# Patient Record
Sex: Male | Born: 1935 | Race: White | Hispanic: No | Marital: Married | Smoking: Former smoker
Health system: Southern US, Community
[De-identification: ages and names within clinical notes are randomized; demographics above are authoritative.]

## PROBLEM LIST (undated history)

## (undated) DIAGNOSIS — Z87898 Personal history of other specified conditions: Secondary | ICD-10-CM

## (undated) DIAGNOSIS — C679 Malignant neoplasm of bladder, unspecified: Secondary | ICD-10-CM

## (undated) DIAGNOSIS — F0150 Vascular dementia without behavioral disturbance: Secondary | ICD-10-CM

## (undated) DIAGNOSIS — K08109 Complete loss of teeth, unspecified cause, unspecified class: Secondary | ICD-10-CM

## (undated) DIAGNOSIS — R972 Elevated prostate specific antigen [PSA]: Secondary | ICD-10-CM

## (undated) DIAGNOSIS — Z972 Presence of dental prosthetic device (complete) (partial): Secondary | ICD-10-CM

## (undated) DIAGNOSIS — D649 Anemia, unspecified: Secondary | ICD-10-CM

## (undated) DIAGNOSIS — Z973 Presence of spectacles and contact lenses: Secondary | ICD-10-CM

## (undated) DIAGNOSIS — N400 Enlarged prostate without lower urinary tract symptoms: Secondary | ICD-10-CM

## (undated) DIAGNOSIS — F05 Delirium due to known physiological condition: Secondary | ICD-10-CM

## (undated) DIAGNOSIS — Z85038 Personal history of other malignant neoplasm of large intestine: Secondary | ICD-10-CM

## (undated) HISTORY — PX: APPENDECTOMY: SHX54

## (undated) HISTORY — PX: TONSILLECTOMY: SUR1361

## (undated) HISTORY — PX: COLONOSCOPY: SHX174

---

## 1986-01-15 HISTORY — PX: LUMBAR SPINE SURGERY: SHX701

## 2005-03-06 ENCOUNTER — Encounter: Admission: RE | Admit: 2005-03-06 | Discharge: 2005-03-06 | Payer: Self-pay | Admitting: Family Medicine

## 2006-07-26 ENCOUNTER — Ambulatory Visit: Payer: Self-pay | Admitting: Internal Medicine

## 2006-08-08 ENCOUNTER — Ambulatory Visit: Payer: Self-pay | Admitting: Internal Medicine

## 2006-08-08 ENCOUNTER — Encounter: Payer: Self-pay | Admitting: Internal Medicine

## 2006-08-08 LAB — CONVERTED CEMR LAB
BUN: 8 mg/dL (ref 6–23)
Creatinine, Ser: 1 mg/dL (ref 0.4–1.5)

## 2006-08-09 ENCOUNTER — Ambulatory Visit: Payer: Self-pay | Admitting: Cardiology

## 2006-08-13 ENCOUNTER — Ambulatory Visit: Payer: Self-pay | Admitting: Internal Medicine

## 2006-08-13 LAB — CONVERTED CEMR LAB
ALT: 19 units/L (ref 0–53)
AST: 19 units/L (ref 0–37)
Albumin: 3.6 g/dL (ref 3.5–5.2)
BUN: 12 mg/dL (ref 6–23)
Basophils Absolute: 0 10*3/uL (ref 0.0–0.1)
Calcium: 9.9 mg/dL (ref 8.4–10.5)
Chloride: 107 meq/L (ref 96–112)
Eosinophils Absolute: 0.1 10*3/uL (ref 0.0–0.6)
Eosinophils Relative: 1.5 % (ref 0.0–5.0)
GFR calc Af Amer: 94 mL/min
GFR calc non Af Amer: 78 mL/min
MCHC: 34.4 g/dL (ref 30.0–36.0)
MCV: 86.7 fL (ref 78.0–100.0)
Monocytes Relative: 7.2 % (ref 3.0–11.0)
Neutro Abs: 3.9 10*3/uL (ref 1.4–7.7)
Platelets: 211 10*3/uL (ref 150–400)
RBC: 4.49 M/uL (ref 4.22–5.81)
TSH: 3.4 microintl units/mL (ref 0.35–5.50)
WBC: 5.9 10*3/uL (ref 4.5–10.5)

## 2006-08-16 ENCOUNTER — Encounter: Admission: RE | Admit: 2006-08-16 | Discharge: 2006-08-16 | Payer: Self-pay | Admitting: Surgery

## 2006-08-20 ENCOUNTER — Encounter (INDEPENDENT_AMBULATORY_CARE_PROVIDER_SITE_OTHER): Payer: Self-pay | Admitting: Surgery

## 2006-08-20 ENCOUNTER — Inpatient Hospital Stay (HOSPITAL_COMMUNITY): Admission: AD | Admit: 2006-08-20 | Discharge: 2006-08-26 | Payer: Self-pay | Admitting: Surgery

## 2006-08-20 HISTORY — PX: OTHER SURGICAL HISTORY: SHX169

## 2006-09-17 ENCOUNTER — Ambulatory Visit: Payer: Self-pay | Admitting: Hematology and Oncology

## 2006-10-09 LAB — CBC WITH DIFFERENTIAL/PLATELET
BASO%: 0.7 % (ref 0.0–2.0)
Eosinophils Absolute: 0.1 10*3/uL (ref 0.0–0.5)
HCT: 39.6 % (ref 38.7–49.9)
LYMPH%: 32.5 % (ref 14.0–48.0)
MCHC: 34.7 g/dL (ref 32.0–35.9)
MONO#: 0.4 10*3/uL (ref 0.1–0.9)
NEUT#: 3.2 10*3/uL (ref 1.5–6.5)
NEUT%: 58.5 % (ref 40.0–75.0)
Platelets: 202 10*3/uL (ref 145–400)
RBC: 4.67 10*6/uL (ref 4.20–5.71)
WBC: 5.5 10*3/uL (ref 4.0–10.0)
lymph#: 1.8 10*3/uL (ref 0.9–3.3)

## 2006-10-09 LAB — CEA: CEA: <0.5 ng/mL (ref 0.0–5.0)

## 2006-10-09 LAB — COMPREHENSIVE METABOLIC PANEL
ALT: 15 U/L (ref 0–53)
CO2: 28 mEq/L (ref 19–32)
Calcium: 9.1 mg/dL (ref 8.4–10.5)
Chloride: 103 mEq/L (ref 96–112)
Glucose, Bld: 85 mg/dL (ref 70–99)
Sodium: 141 mEq/L (ref 135–145)
Total Bilirubin: 0.5 mg/dL (ref 0.3–1.2)
Total Protein: 6.5 g/dL (ref 6.0–8.3)

## 2006-12-30 ENCOUNTER — Ambulatory Visit: Payer: Self-pay | Admitting: Hematology and Oncology

## 2007-01-01 ENCOUNTER — Ambulatory Visit (HOSPITAL_COMMUNITY): Admission: RE | Admit: 2007-01-01 | Discharge: 2007-01-01 | Payer: Self-pay | Admitting: Hematology and Oncology

## 2007-01-01 LAB — COMPREHENSIVE METABOLIC PANEL
ALT: 22 U/L (ref 0–53)
AST: 21 U/L (ref 0–37)
Albumin: 4 g/dL (ref 3.5–5.2)
Alkaline Phosphatase: 57 U/L (ref 39–117)
BUN: 13 mg/dL (ref 6–23)
Chloride: 101 mEq/L (ref 96–112)
Potassium: 4.4 mEq/L (ref 3.5–5.3)
Sodium: 139 mEq/L (ref 135–145)

## 2007-01-01 LAB — CBC WITH DIFFERENTIAL/PLATELET
BASO%: 0.4 % (ref 0.0–2.0)
EOS%: 1.4 % (ref 0.0–7.0)
MCH: 29.3 pg (ref 28.0–33.4)
MCHC: 34.5 g/dL (ref 32.0–35.9)
MONO#: 0.4 10*3/uL (ref 0.1–0.9)
RBC: 4.96 10*6/uL (ref 4.20–5.71)
RDW: 13.6 % (ref 11.2–14.6)
WBC: 4.8 10*3/uL (ref 4.0–10.0)
lymph#: 1.4 10*3/uL (ref 0.9–3.3)

## 2007-01-01 LAB — CEA: CEA: 0.9 ng/mL (ref 0.0–5.0)

## 2007-01-24 ENCOUNTER — Ambulatory Visit (HOSPITAL_COMMUNITY): Admission: RE | Admit: 2007-01-24 | Discharge: 2007-01-24 | Payer: Self-pay | Admitting: Hematology and Oncology

## 2007-02-03 ENCOUNTER — Ambulatory Visit: Payer: Self-pay | Admitting: Thoracic Surgery (Cardiothoracic Vascular Surgery)

## 2007-02-13 ENCOUNTER — Ambulatory Visit: Payer: Self-pay | Admitting: Thoracic Surgery (Cardiothoracic Vascular Surgery)

## 2007-02-18 ENCOUNTER — Ambulatory Visit: Payer: Self-pay | Admitting: Thoracic Surgery (Cardiothoracic Vascular Surgery)

## 2007-02-18 ENCOUNTER — Encounter: Payer: Self-pay | Admitting: Thoracic Surgery (Cardiothoracic Vascular Surgery)

## 2007-02-18 ENCOUNTER — Inpatient Hospital Stay (HOSPITAL_COMMUNITY)
Admission: RE | Admit: 2007-02-18 | Discharge: 2007-02-23 | Payer: Self-pay | Admitting: Thoracic Surgery (Cardiothoracic Vascular Surgery)

## 2007-02-18 HISTORY — PX: VIDEO ASSISTED THORACOSCOPY (VATS)/WEDGE RESECTION: SHX6174

## 2007-02-28 ENCOUNTER — Ambulatory Visit: Payer: Self-pay | Admitting: Thoracic Surgery (Cardiothoracic Vascular Surgery)

## 2007-03-21 ENCOUNTER — Ambulatory Visit: Payer: Self-pay | Admitting: Cardiothoracic Surgery

## 2007-03-21 ENCOUNTER — Encounter: Admission: RE | Admit: 2007-03-21 | Discharge: 2007-03-21 | Payer: Self-pay | Admitting: Cardiothoracic Surgery

## 2007-04-14 ENCOUNTER — Encounter
Admission: RE | Admit: 2007-04-14 | Discharge: 2007-04-14 | Payer: Self-pay | Admitting: Thoracic Surgery (Cardiothoracic Vascular Surgery)

## 2007-04-14 ENCOUNTER — Ambulatory Visit: Payer: Self-pay | Admitting: Thoracic Surgery (Cardiothoracic Vascular Surgery)

## 2007-04-29 DIAGNOSIS — C189 Malignant neoplasm of colon, unspecified: Secondary | ICD-10-CM | POA: Insufficient documentation

## 2007-04-29 DIAGNOSIS — D126 Benign neoplasm of colon, unspecified: Secondary | ICD-10-CM | POA: Insufficient documentation

## 2007-10-06 ENCOUNTER — Ambulatory Visit: Payer: Self-pay | Admitting: Thoracic Surgery (Cardiothoracic Vascular Surgery)

## 2007-10-06 ENCOUNTER — Encounter
Admission: RE | Admit: 2007-10-06 | Discharge: 2007-10-06 | Payer: Self-pay | Admitting: Thoracic Surgery (Cardiothoracic Vascular Surgery)

## 2008-02-26 ENCOUNTER — Encounter: Payer: Self-pay | Admitting: Internal Medicine

## 2008-05-28 ENCOUNTER — Encounter: Payer: Self-pay | Admitting: Internal Medicine

## 2008-06-28 ENCOUNTER — Ambulatory Visit: Payer: Self-pay | Admitting: Internal Medicine

## 2008-07-15 ENCOUNTER — Encounter: Payer: Self-pay | Admitting: Internal Medicine

## 2008-07-15 ENCOUNTER — Ambulatory Visit: Payer: Self-pay | Admitting: Internal Medicine

## 2008-07-20 ENCOUNTER — Encounter: Payer: Self-pay | Admitting: Internal Medicine

## 2008-12-06 IMAGING — CR DG CHEST 1V PORT
1 series · 1 of 1 positions shown · non-contrast
Comparison: Earlier the same day.

CLINICAL DATA: Lung lesion post VATS. 
 PORTABLE CHEST - 1 VIEW - 02/21/07 AT 1718 HOURS:

[view not recorded]
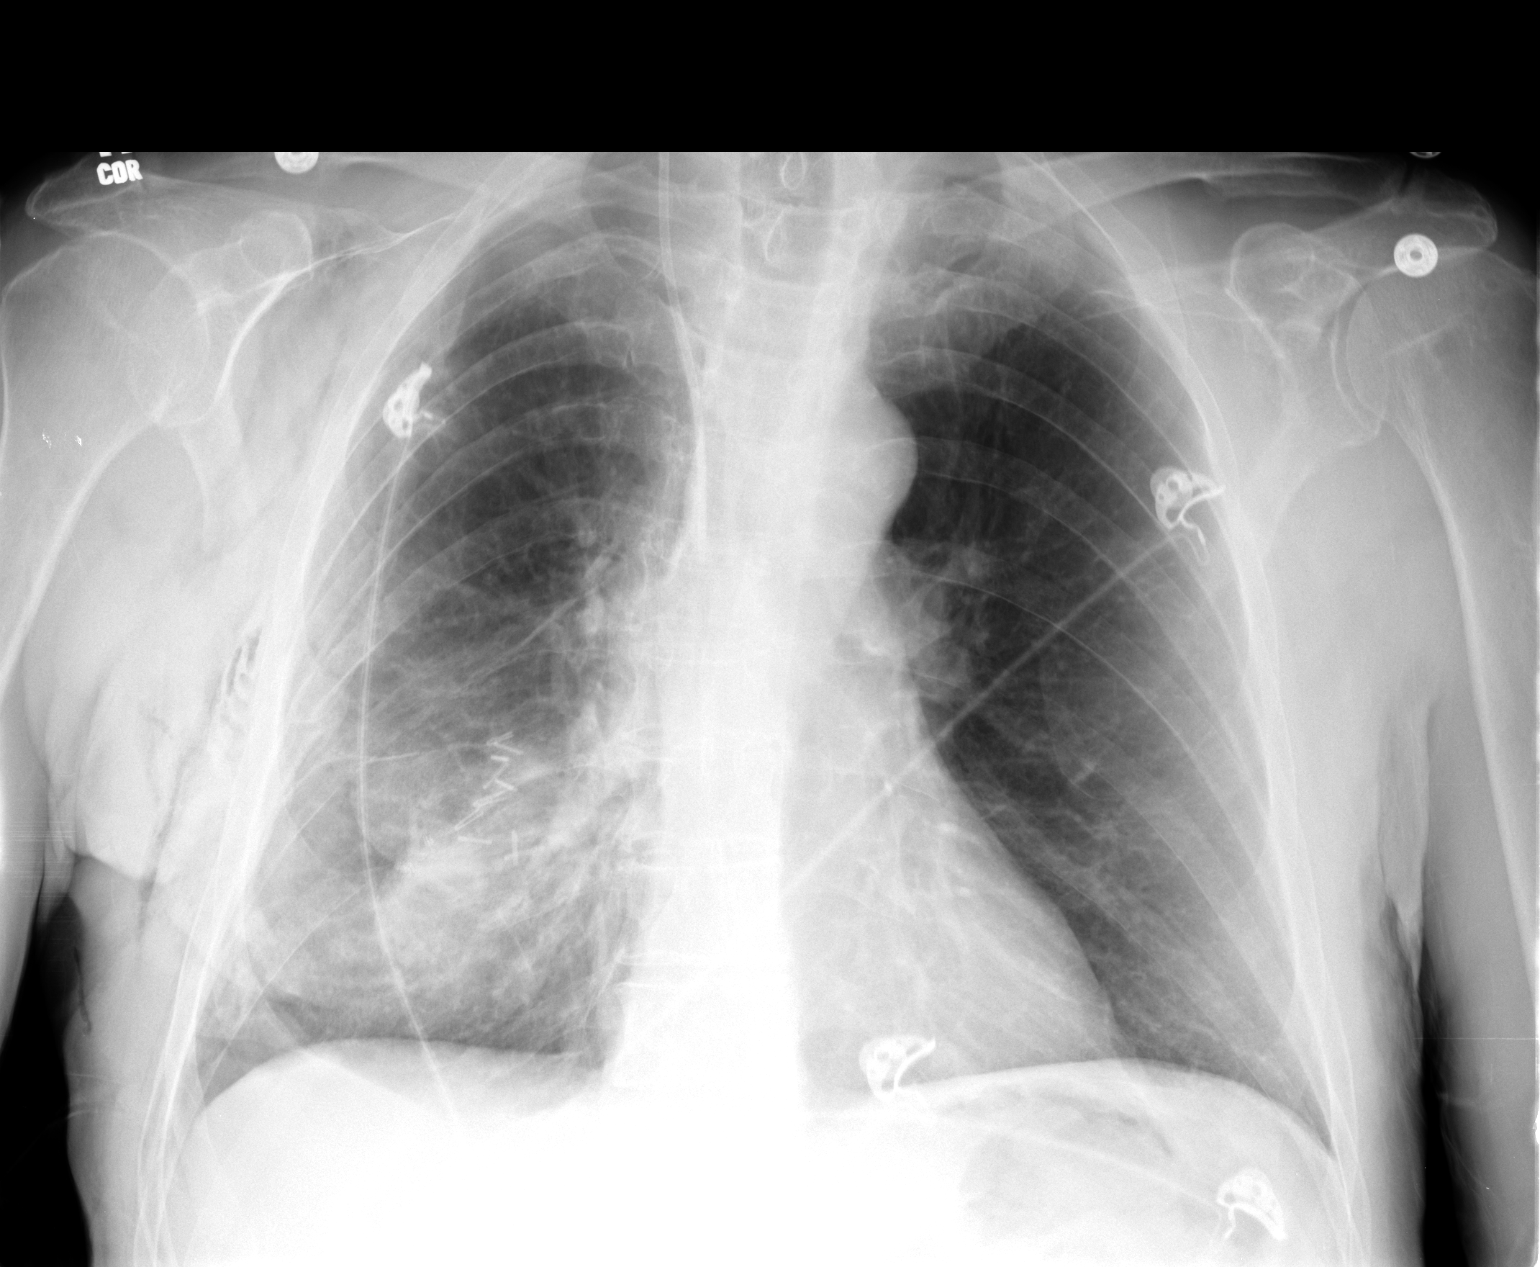

[1 of 1 positions shown; findings below may reference images not displayed]

FINDINGS: Right IJ central venous catheter is in stable position.  The right-sided pneumothorax appears unchanged with apical and basilar components, estimated at 15%.  Right perihilar atelectasis or contusion is unchanged.  The left lung is clear.
IMPRESSION: Stable right-sided pneumothorax following chest tube removal.  No new findings.

## 2008-12-07 IMAGING — CR DG CHEST 2V
2 series · 2 of 2 positions shown · non-contrast
Comparison: 02/21/07.

CLINICAL DATA: pneumothorax
PA AND LATERAL CHEST - 2 VIEW - 02/22/07:

[w chest pa]
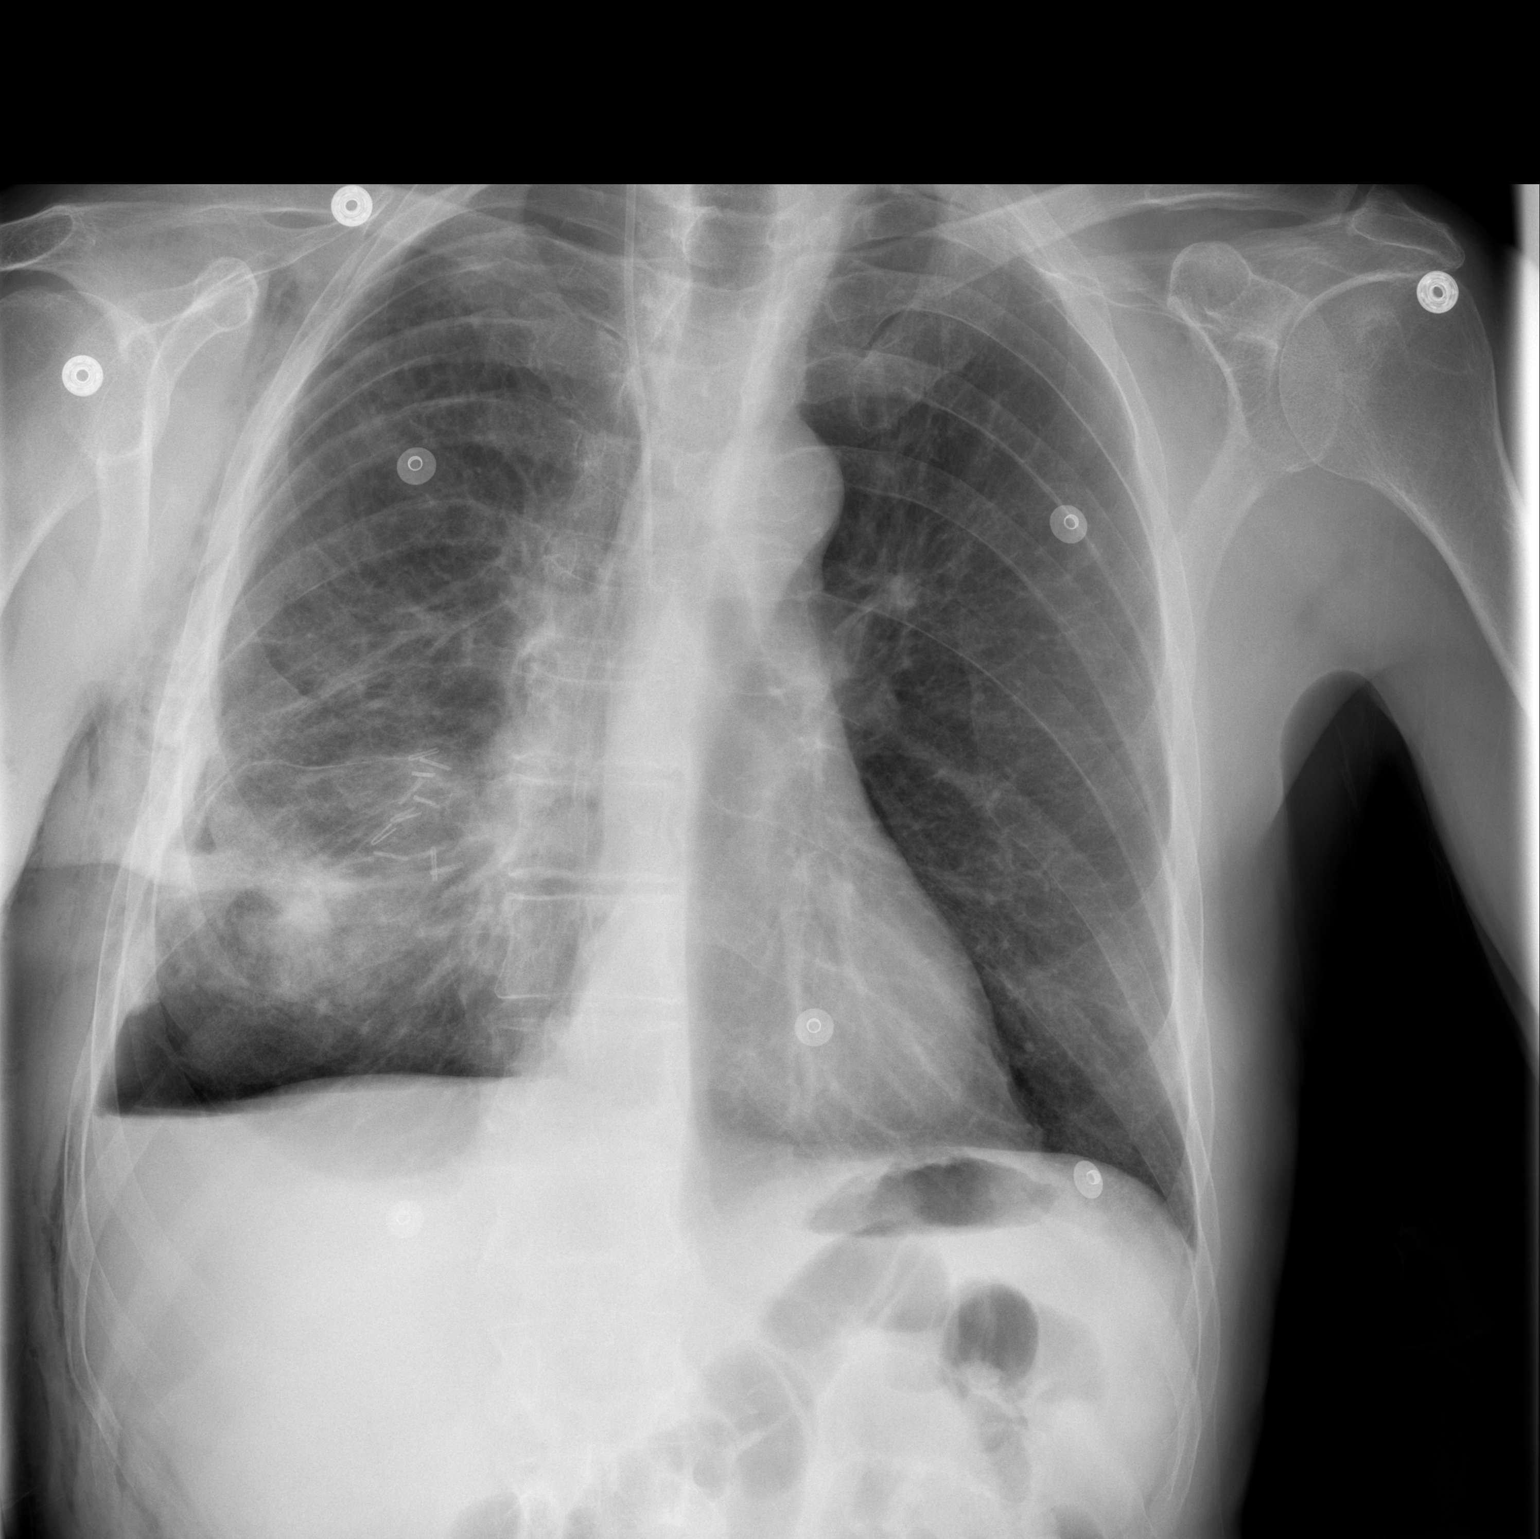

[w chest lat]
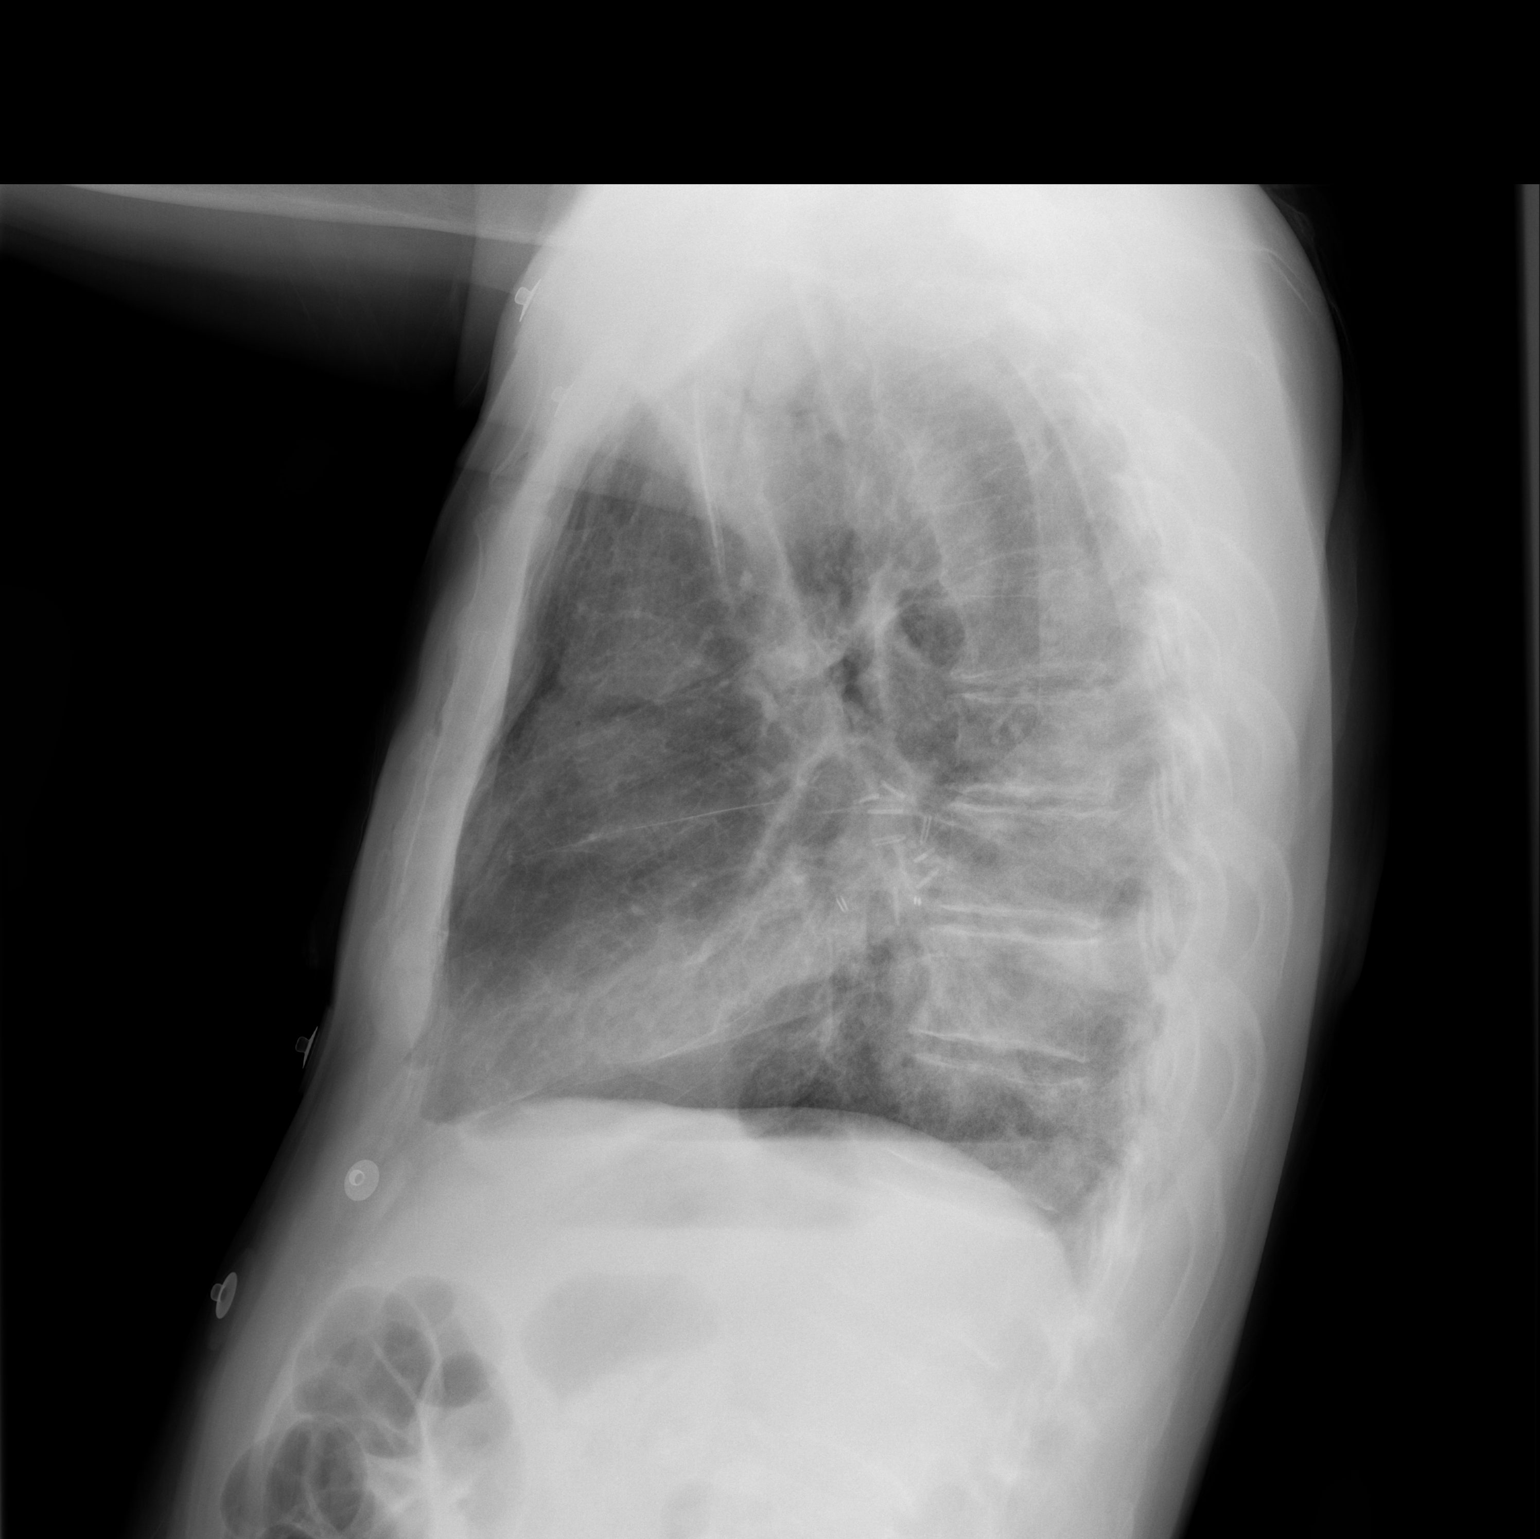

[2 of 2 positions shown; findings below may reference images not displayed]

FINDINGS: There is slight increase in the size of the basal component of the right pneumothorax.  The patient now has some right effusion.  The apical component of the pneumothorax is essentially unchanged.  There is some atelectasis at the right lung base which is stable.  The left lung is clear.  Heart size and vascularity are normal.
IMPRESSION: Slight increased right basal pneumothorax.  No change in the apical component.

## 2008-12-08 IMAGING — CR DG CHEST 2V
2 series · 2 of 2 positions shown · non-contrast
Comparison: 02/22/07.

CLINICAL DATA: Pneumothorax. 
 CHEST ? 2 VIEW:

[w chest pa]
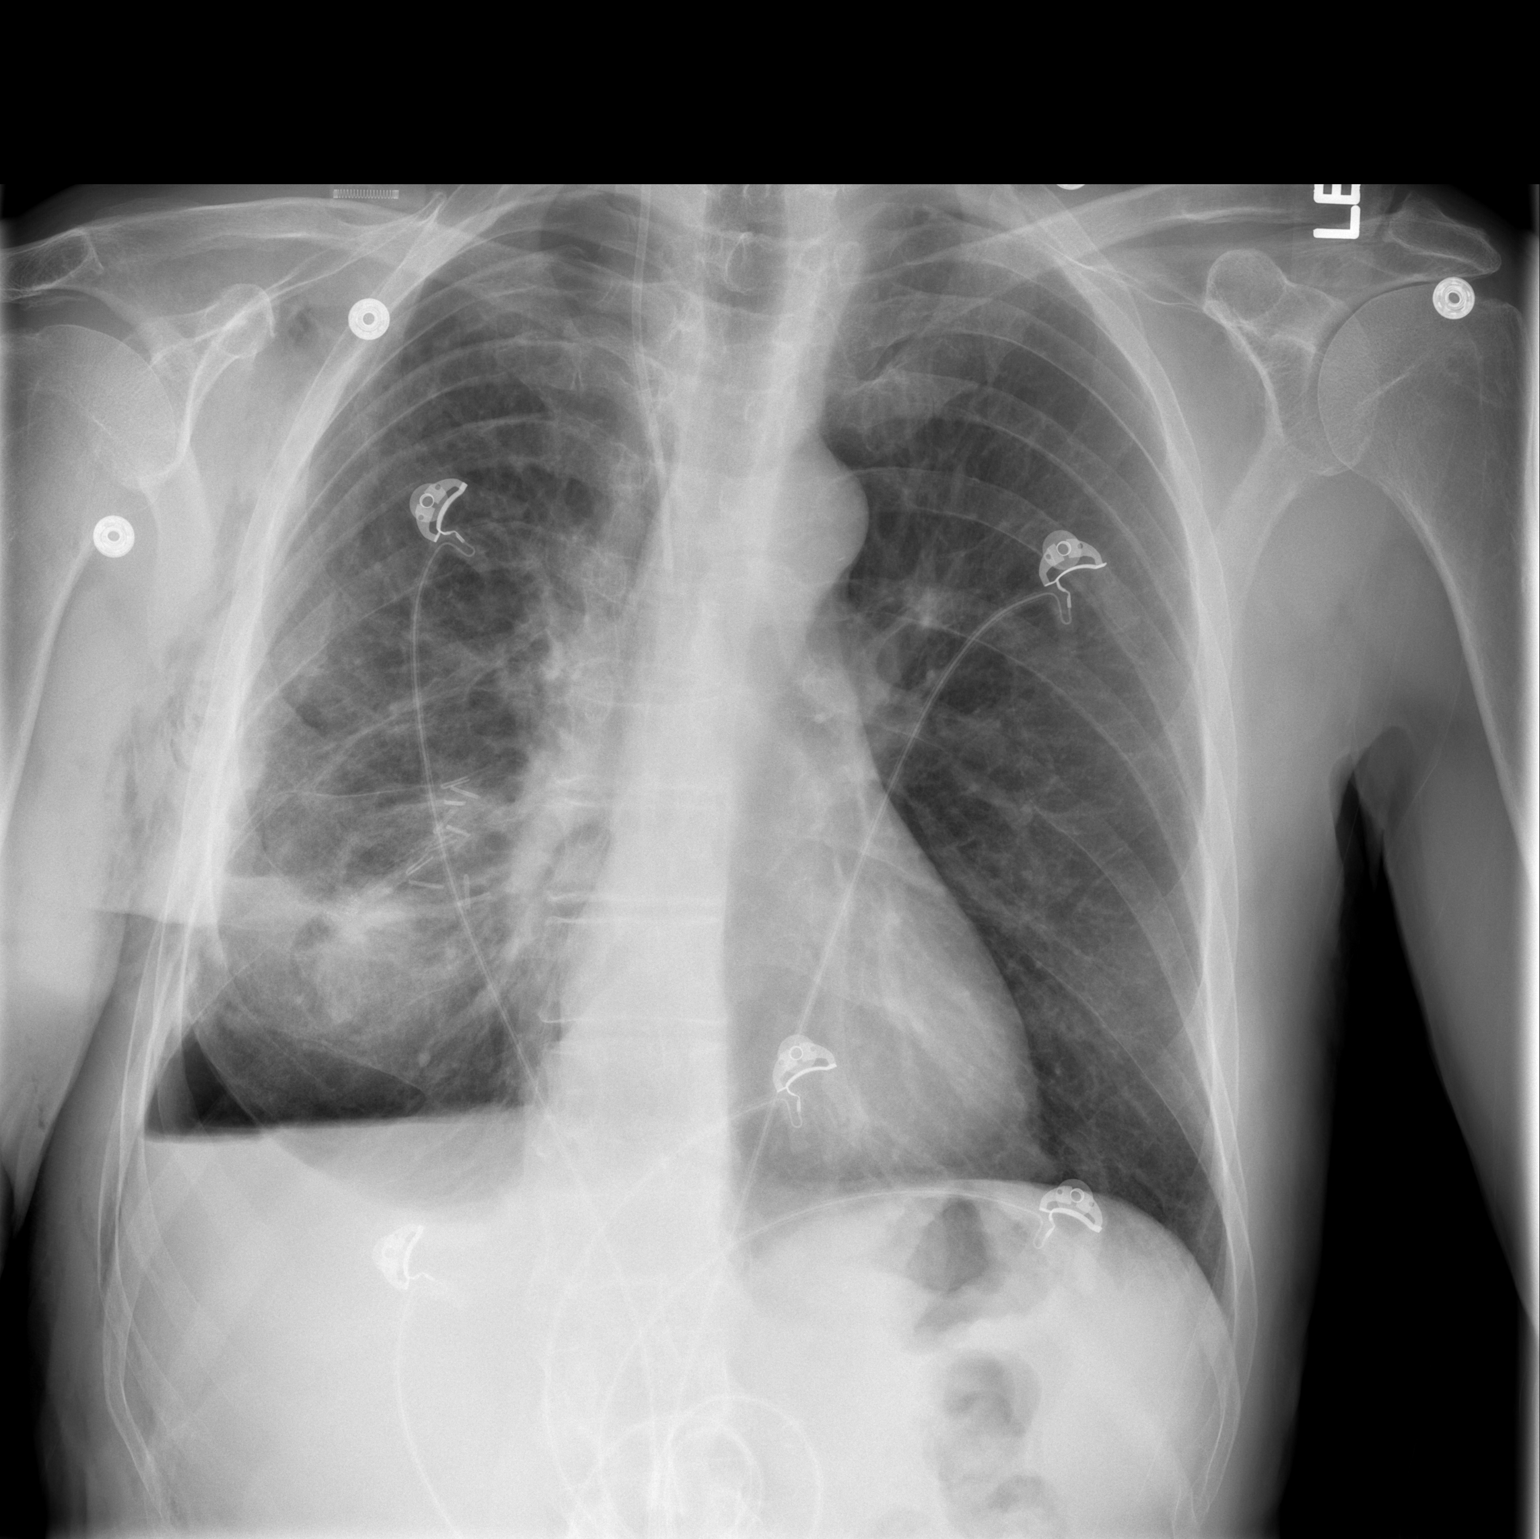

[w chest lat]
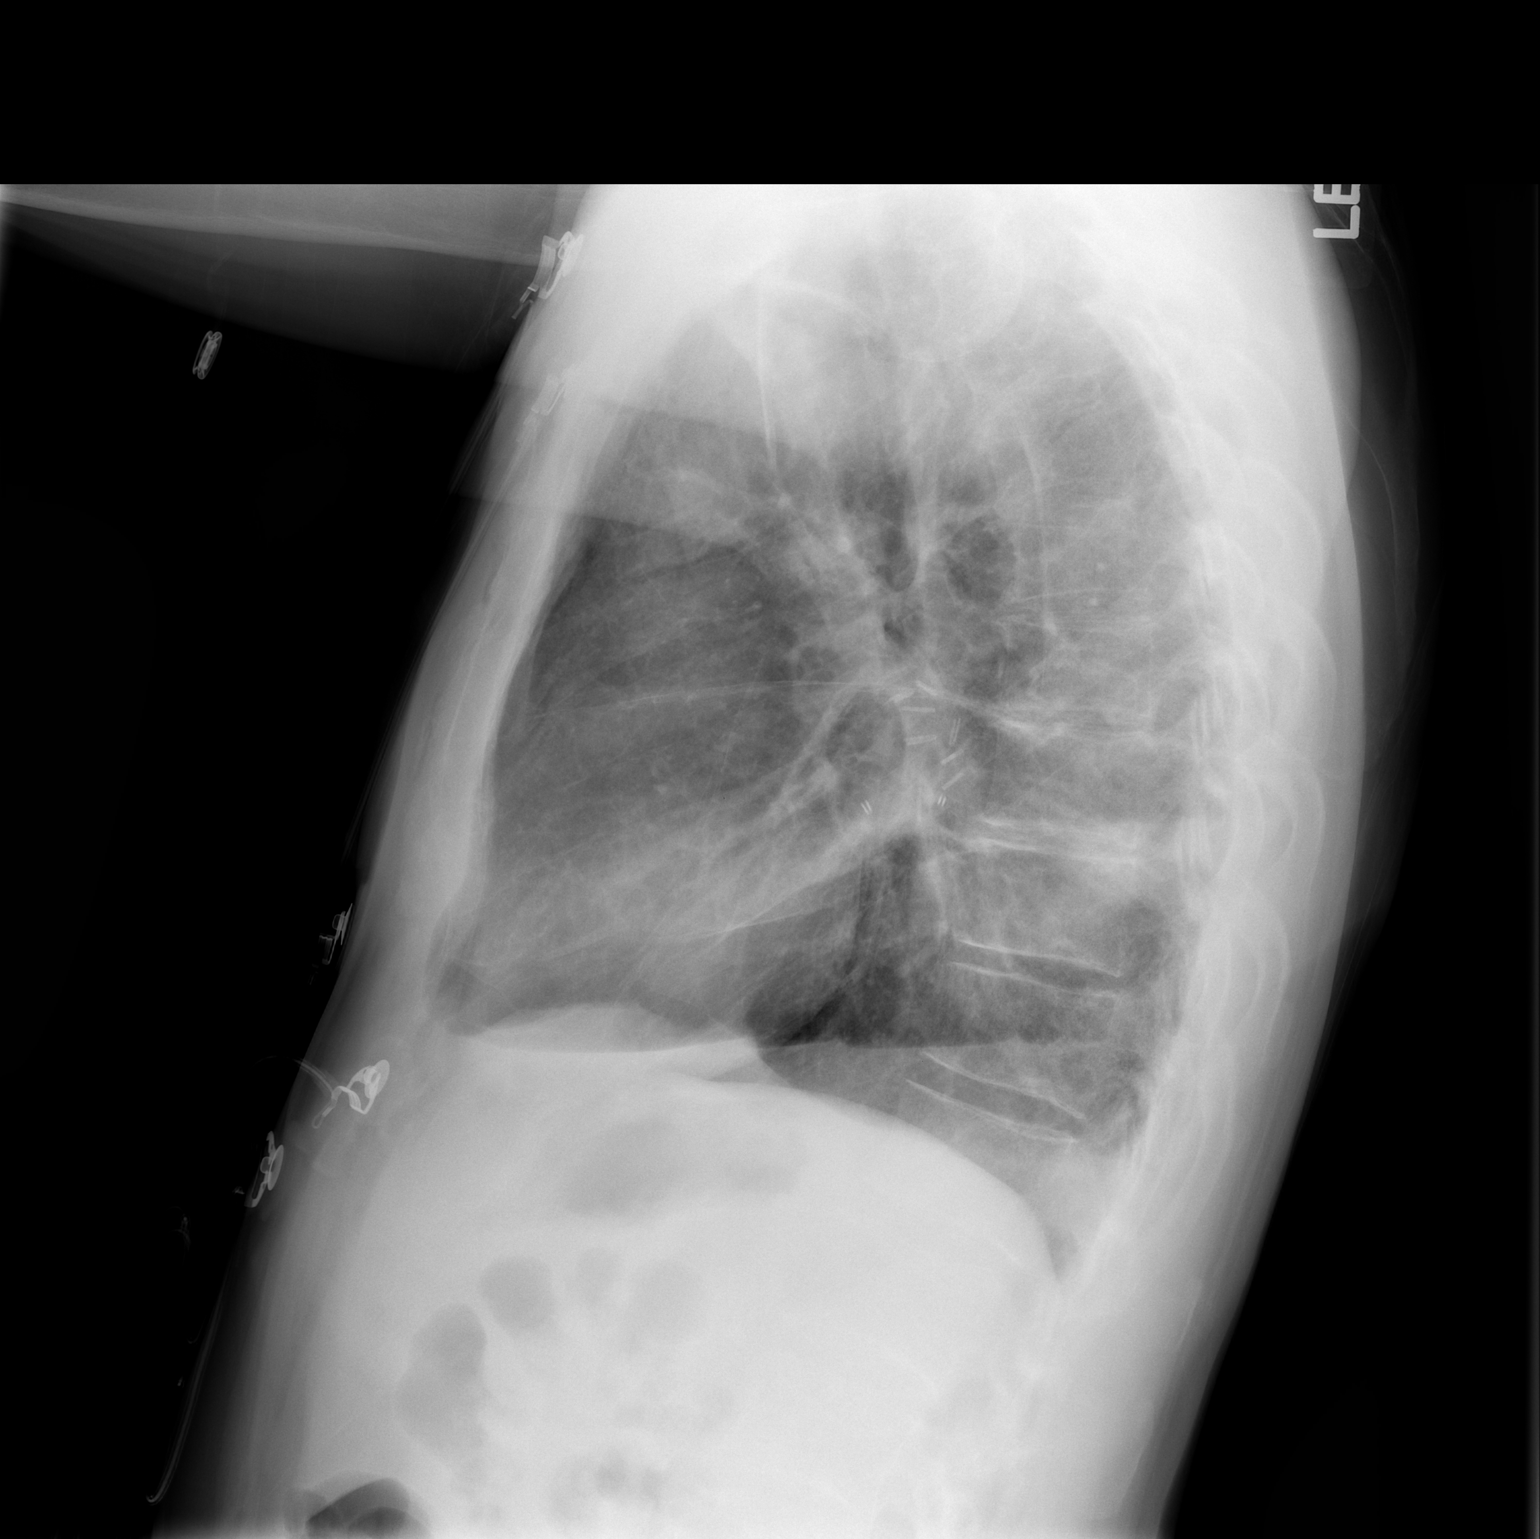

[2 of 2 positions shown; findings below may reference images not displayed]

FINDINGS: The right basal hydropneumothorax and small apical component of pneumothorax are unchanged.  No increase in the subcutaneous emphysema.  The left lung remains clear.  Heart size and vascularity are normal.  Central line is unchanged.
IMPRESSION: No significant change.

## 2010-05-30 NOTE — Op Note (Signed)
NAME:  Brett Santiago, Brett Santiago NO.:  [id number]   MEDICAL RECORD NO.:  [id number]          PATIENT TYPE:  INP   LOCATION:  3302                         FACILITY:  [facility]   PHYSICIAN:  Salvatore Decent. Cornelius Moras, M.D. DATE OF BIRTH:  Jul 23, 1929   DATE OF PROCEDURE:  02/18/2007  DATE OF DISCHARGE:                               OPERATIVE REPORT   PREOPERATIVE DIAGNOSIS:  Right lung nodule and hypermetabolic right  hilar lymph node.   POSTOPERATIVE DIAGNOSIS:  Right lung nodule and hypermetabolic right  hilar lymph node.   PROCEDURE:  Right video assisted thoracoscopic surgery for wedge  resection of right lung nodule and opened thoracoscopic biopsy of right  hilar lymph node.   SURGEON:  Dr. Purcell Nails   ASSISTANT:  Ms. Zadie Rhine   ANESTHESIA:  General.   BRIEF CLINICAL NOTE:  The patient is a 77 year old gentleman who has  been healthy most of his life.  In August 2008 he underwent right  colectomy for node-negative adenocarcinoma of the colon.  He was seen in  follow-up [facility]  A  routine screening CT scan of the chest, abdomen and pelvis was performed  in December 2008.  CT scan revealed a suspicious nodule in the right  lung.  A PET CT scan was subsequently performed.  The lung nodule was  not hypermetabolic but a hypermetabolic lymph node was noted in the  right hilum.  The patient was referred for thoracic surgical  consultation.  Alternative treatment strategies were discussed at  length.  In particular, the possibility of close observation versus  proceeding directly for surgical biopsy were entertained.  After  considerable discussion, the patient specifically desires to proceed  with surgery for definitive biopsy of the lesions in question.  A full  consultation has been dictated previously.  The patient and wife  understand and accept all potential associated risks of surgery and  desire to proceed as  described.   OPERATIVE NOTE IN DETAIL:  The patient brought to the operating room on  the above-mentioned date and placed in the supine position on the  operating table.  Radial arterial line and central venous catheters were  placed for intravenous access and monitoring purposes.  Pneumatic  sequential compression boots were placed on both lower extremities.  Intravenous antibiotics were administered.  General endotracheal  anesthesia is induced uneventfully using a dual-lumen endotracheal tube.  A Foley catheter is placed.  The patient is turned to the left lateral  decubitus position using a pneumatic beanbag device and axillary roll to  facilitate positioning.  Single lung ventilation is begun.  The  patient's right chest is prepared, draped in sterile manner.   A small incision is made overlying the seventh intercostal space  anteriorly.  The incision is completed through the subcutaneous tissues  and intercostal musculature with electrocautery.  The right pleural  space was entered bluntly.  A 10 mm port is passed through the incision  and the right chest was explored visually with the endoscopic camera.  There are no adhesions and  the lung was completely collapsed.  There are  no gross abnormalities visible on the surface of the lung.  There is no  gross abnormalities appreciated in the hilum.  There is no pleural  effusion.  Two additional incisions were placed including one located  posterolaterally through the fifth intercostal space and another located  anteriorly through the fourth intercostal space.  Initially the lung was  mobilized up against the surgeon's digit which is extended through the  posterolateral incision in an effort to palpate the nodule noted to be  lateral in the right lower lobe.  This nodule somewhat difficult to  locate, but ultimately was located straight laterally just below the  major fissure in the right lower lobe.  The Echelon endoscopic GIA   stapling device was utilized to perform wedge resection of a small  portion of the right lower lobe with the afflicted nodule within it.  This is technically straightforward and the specimen is sent to  pathology for frozen section histology.  Preliminary frozen section  histology of the right lower lobe mass was notable for benign hamartoma.   Attention is now addressed towards the area where the abnormal lymph  node is noted in the right hilum on preoperative chest CT scan and PET  scan.  In particular, this lymph node is truly an interlobar lymph node  located in the floor of the major fissure alongside the takeoff of the  segmental branches of the pulmonary artery to the right middle lobe just  off the continuation of the right pulmonary artery.  On palpating in  this region one can appreciate a firm abnormally enlarged lymph node.  The floor of the major fissure was incised and careful dissection is  performed to expose this lymph node.  This lymph node appears to be  chronically inflamed and is quite adherent to the adjacent pulmonary  artery.  Dissection is somewhat tedious, but ultimately the lymph node  is dissected free satisfactorily to facilitate biopsy of a portion of  this firm lymph node.  A portion of this was sent to pathology for  frozen section histology.  Preliminary frozen section histology is  notable for benign lymph node with no sign of any malignancy.  Initial  portion of the lymph node is sent to pathology also for culture to  include fungal, AFB and routine cultures.  Meticulous hemostasis  ascertained.  Several medium size hemoclips were placed in the vicinity  of this enlarged lymph node to mark it radiographically.  The right  chest was filled with warm saline solution and the right lung was  briefly inflated.  There is no sign of significant air leak.   The On-Q continuous pain management system is utilized to facilitate  postoperative pain control.  One  5-inch catheter supplied with the On-Q  kit is tunneled initially through the subcutaneous tissues and then  tunneled posteriorly and placed subpleurally to cover the second through  the sixth intercostal nerve roots.  The catheter was flushed with 5 mL  of 0.5% bupivacaine solution and ultimately connected to a continuous  infusion pump.  The right chest was drained using a single 28-French  right-angle chest tube exited through the original thoracoscopic port  incision inferiorly.  The two remaining miniature thoracotomy incisions  were closed in multiple layers and their skin incisions were closed with  subcuticular skin closures.   The patient tolerated the procedure well, was extubated in the operating  room, and transported recovery room  in stable condition.  There are no  intraoperative complications.  All sponge, instrument and needle counts  verified correct.  Estimated blood loss for the procedure was less than  150 mL.      Salvatore Decent. Cornelius Moras, M.D.  Electronically Signed     CHO/MEDQ  D:  02/18/2007  T:  02/19/2007  Job:  [id number]   cc:   Vicente Serene I. Odogwu, M.D.  Quita Skye Artis Flock, M.D.  Currie Paris, M.D.  Wilhemina Bonito. Marina Goodell, MD

## 2010-05-30 NOTE — Assessment & Plan Note (Signed)
OFFICE VISIT   Brett Santiago, Brett Santiago  DOB:  [birth year]                                        February 13, 2007  CHART #:  [id number]   HISTORY OF PRESENT ILLNESS:  The patient returns for further followup  related to his recently discovered lung nodules.  He was originally seen  in consultation on February 03, 2007.  Since then he is contemplating  alternative treatment strategies, including continued close observation  versus proceeding directly to surgery for right VATS and probable right  thoracotomy for wedge resection of the peripheral right lung nodule and  biopsy of the hilar lymph node that was abnormal on recent PET scan.  The patient has decided that he favors the more aggressive approach and  hopes to proceed with surgery.  I am hopeful that there may still be a  chance that these matters are benign, but the hypermetabolic mass in the  right hilum is a bit concerning.  I have discussed matters at length  again with the patient and his wife.  The risks and benefits of the  alternative approaches have been discussed.  The nature of the surgical  procedure has been reviewed in detail, and we spent in excess of 30  minutes discussing the procedure with its attendant risks and benefits.  All of their questions have been addressed.  We plan to proceed with  right VATS and probable right thoracotomy for wedge resection of the  peripheral lung nodule, direct open biopsy of the right hilar lymph  node.  They understand that if either of these lesions are malignant,  this may warrant further pulmonary resection if these appears to be  primary lung cancer, potentially by lobectomy.  They understand and  accept all associated risks of surgery and desire to proceed as  described.   Salvatore Decent. Cornelius Moras, M.D.  Electronically Signed   CHO/MEDQ  D:  02/13/2007  T:  02/14/2007  Job:  [id number]   cc:   Vicente Serene I. Odogwu, M.D.  Quita Skye Artis Flock, M.D.  Currie Paris,  M.D.  Wilhemina Bonito. Marina Goodell, MD

## 2010-05-30 NOTE — Discharge Summary (Signed)
Brett Santiago, Brett Santiago NO.:  [id number]   MEDICAL RECORD NO.:  [id number]          PATIENT TYPE:  INP   LOCATION:  1522                         FACILITY:  [facility]   PHYSICIAN:  Currie Paris, M.D.DATE OF BIRTH:  04/25/1929   DATE OF ADMISSION:  08/20/2006  DATE OF DISCHARGE:  08/26/2006                               DISCHARGE SUMMARY   OFFICE MEDICAL RECORD NUMBER:  [id number].   FINAL DIAGNOSIS:  Invasive moderately differentiated adenocarcinoma (2-  cm) of ascending colon (T2 N0).   CLINICAL HISTORY:  [name] is a 77 year old gentleman who presented  with blood in his stool which was guaiac positive.  He had a colonoscopy  and carcinoma of the ascending colon near the cecum was found.  He was  admitted for surgery.   HOSPITAL COURSE:  The patient was admitted and taken to the operating  room where a laparoscopic-assisted right colectomy was performed.  He  tolerated the procedure well.  Postoperatively, he had a fairly benign course.  He was a little slow to  pass gas but once that happened, we were able to begin some liquids and  advance his diet.  By August 26, 2006, he was passing gas, taking p.o.  nicely with no nausea or vomiting.  His abdomen was soft and nontender,  and he had some bowel sounds noted.  It was felt that he was able to be  discharged and he was sent home on a regular diet, usual home  medications, and Vicodin for pain.   LABORATORY STUDIES:  His pathology showed carcinoma as above.  Nineteen  nodes were negative for metastatic carcinoma.  His admission hemoglobin  pre-op was in the 13 range.  We got one post-op that was 17.9 but a  followup the next day was back to 13.2, and 17.9 was thought to be lab  error.  Urinalysis was negative.  EKG showed a sinus bradycardia but was  otherwise normal.  Preoperative chest x-ray showed no acute  cardiopulmonary disease.      Currie Paris, M.D.  Electronically Signed     CJS/MEDQ  D:  09/13/2006  T:  09/13/2006  Job:  [id number]   cc:   Quita Skye. Artis Flock, M.D.  Fax: [phone]   Wilhemina Bonito. Marina Goodell, MD  [address]  [place]  [place] [address]

## 2010-05-30 NOTE — Discharge Summary (Signed)
NAME:  Brett Santiago, HELMING NO.:  [id number]   MEDICAL RECORD NO.:  [id number]          PATIENT TYPE:  INP   LOCATION:  2006                         FACILITY:  [facility]   PHYSICIAN:  Salvatore Decent. Cornelius Moras, M.D. DATE OF BIRTH:  [birth year]   DATE OF ADMISSION:  02/18/2007  DATE OF DISCHARGE:  02/23/2007                               DISCHARGE SUMMARY   ADDENDUM:  An addendum to the patient's discharge summary dictated on  February 21, 2007.   The patient was initially felt to be ready for discharge home on  February 22, 2007. His chest tube was discontinued February 21, 2007 with  a followup chest x-ray showing only a small pneumothorax. A repeat PA  and lateral chest x-ray done in the a.m. on February 7th for followup  showed a slight increase in the patient's right pneumothorax. It was  felt due to this change in his chest x-ray that the patient should stay  in the hospital another day for repeat followup chest x-ray to monitor  his pneumothorax. The patient's vital signs were stable, and he remained  off oxygen, satting greater than 90%. He denied any shortness of breath.  A repeat PA and lateral chest x-ray done on February 23, 2007 showed his  right hydropneumothorax to be stable. No other issues were noted during  this time. The patient was felt to be stable and ready for discharge  home on February 23, 2007. For details of the patient's followup  appointments, discharge instructions, and medications please see the  dictated discharge summary.      Theda Belfast, PA      Salvatore Decent. Cornelius Moras, M.D.  Electronically Signed    KMD/MEDQ  D:  04/01/2007  T:  04/01/2007  Job:  [id number]   cc:   Salvatore Decent. Cornelius Moras, M.D.

## 2010-05-30 NOTE — Op Note (Signed)
NAME:  Brett Santiago, Brett Santiago NO.:  [id number]   MEDICAL RECORD NO.:  [id number]          PATIENT TYPE:  INP   LOCATION:  0003                         FACILITY:  [facility]   PHYSICIAN:  Currie Paris, M.D.DATE OF BIRTH:  August 08, 1929   DATE OF PROCEDURE:  08/20/2006  DATE OF DISCHARGE:                               OPERATIVE REPORT   PREOPERATIVE DIAGNOSES:  Carcinoma of the cecum.   POSTOPERATIVE DIAGNOSES:  Carcinoma of the cecum.   OPERATION:  Laparoscopic-assisted right hemicolectomy.   SURGEON:  Dr. Jamey Ripa   ASSISTANT:  Dr. Derrell Lolling.   ANESTHESIA:  General endotracheal.   CLINICAL HISTORY:  Brett Santiago is a 77 year old gentleman who presents  with a right colon cancer that was really in the cecum shown by biopsy.  Preoperative evaluation showed a questionable liver lesion but was  unable to be characterized definitely by CT or MRI but thought to  represent an atypical hemangioma.  He was brought to the operating room  for right colectomy.   DESCRIPTION OF PROCEDURE:  The patient was seen in the holding area and  had no further questions.  He was taken to the operating room and after  satisfactory general endotracheal anesthesia had been obtained the  abdomen was clipped, prepped and draped and a catheter placed in the  bladder.   The time-out occurred.   I started by making an umbilical incision, opening the fascia and  entering the peritoneal cavity under direct vision.  A pursestring was  placed, the Hasson introduced and the abdomen insufflated to 15.  With  the camera in place I put a 5 mm port in the epigastrium and a 10/11 in  the left lower quadrant, both placed under direct vision.  Perusal of  the abdominal cavity revealed multiple adhesions of the cecum up to the  anterior abdominal wall from his prior appendectomy.  The liver looked  completely normal.  There was no evidence of peritoneal implants.  We  did note one small hemangioma in the left  lobe of the liver, perhaps a  centimeter in size.  This appeared to be near the anterior edge.  I did  not see any other lesions and nothing to suggest a metastatic lesion in  the liver.   I needed to put one more port for traction so that was placed in the  right midabdomen.  Using the camera in the left lower quadrant port I  was able to take the adhesions down sharply with scissors and some with  the harmonic.  Once I had the cecum freed from the anterior abdominal  wall, I then freed some adhesions of the terminal ileum down into the  pelvis again, I think left over from his appendectomy.  I mobilized the  cecum so we had that well mobilized.  The patient was then placed in  Trendelenburg and tilted to the left for this portion of the procedure.  Once we had all this freed up I then took the harmonic and freed up the  ascending colon and around the hepatic flexure.  We had to  shift the  patient's position to some head up for this.  I saw the duodenum and we  swept the colon mesentery off the duodenum and had complete mobilization  of the entire right colon.   Once this was done I removed the umbilical port and we enlarged that  incision to about with the width of my hand.  I put the protractor  retractor in.  The entire terminal ileum, ascending colon and transverse  colon came easily out into the wound.  I picked up points to divide the  small bowel and colon.  I then divided the mesentery primarily with the  LigaSure going from the small bowel down to the base of the mesentery  and from the colon down to the base of the mesentery.  The large vessels  at the base of the mesentery were clamped, tied and suture ligated with  2-0 silk for better hemostasis.   At point I packed the antimesenteric border of the small bowel of the  colon in an area which I thought would be the end of the stapled  anastomosis.  I then opened the antimesenteric border of both in an area  of the bowel to  be resected and inserted the GIA and fired it.  The  staple line appeared dry.   The TA-60 was then used to come across all the limbs and fired.  The  staple line was inspected and appeared completely dry.  The anastomosis  appeared widely patent.  There was no evidence of any tension.  We had  excellent blood supply.   The defect in the mesentery was closed with interrupted figure eight 2-0  silks.  We checked everything for hemostasis, irrigated and then  prepared to close.  At this point I stopped and opened the specimen off  the table to make sure that the lesion was contained within the  specimen.  We saw a flat ulcerated area in the cecum consistent with his  known carcinoma.   With new gloves and instruments we irrigated again and then closed the  fascia with #1 running PDS.  I placed a camera back into the left lower  quadrant port.  We reinsufflated and again checked to make sure  everything was dry and everything looked completely dry with no evidence  of any bleeding.   The trocars were removed and the abdomen deflated.  The skin was closed  with staples.  The patient tolerated the procedure well and there were  no operative complications.  All counts were correct.      Currie Paris, M.D.  Electronically Signed     CJS/MEDQ  D:  08/20/2006  T:  08/20/2006  Job:  [id number]   cc:   Wilhemina Bonito. Marina Goodell, MD  [address]  [place]  [place] [address]   Quita Skye. Artis Flock, M.D.  Fax: [phone]

## 2010-05-30 NOTE — Consult Note (Signed)
NEW PATIENT CONSULTATION   Brett Santiago, Brett Santiago  DOB:  February 28, 1929                                        February 03, 2007  CHART #:  [id number]   PRIMARY CARE PHYSICIAN:  Quita Skye. Artis Flock, M.D.   REASON FOR CONSULTATION:  Lung mass.   HISTORY OF PRESENT ILLNESS:  The patient is a 77 year old, retired,  white male from [place] who has been remarkably healthy most of his  life.  However, on routine followup with Dr. Artis Flock he was noted to have  hemoccult positive stool.  This prompted a colonoscopy that led to the  diagnosis of colon cancer.  The patient underwent laparoscopic right  colectomy in August 2008, by Dr. Jamey Ripa.  Final pathology was consistent  with T2 N0 moderately differentiated adenocarcinoma of the colon.  He  was subsequently seen in followup by Dr. Dalene Carrow from the [facility].  He underwent routine screening CT scan of the chest, abdomen,  and pelvis on January 01, 2007.  A suspicious nodule in the right lung  was identified.  No previous chest CT scans were available for  comparison, although no nodular opacity was appreciated on routine chest  x-ray performed in August of last year.  His CEA levels remain low  (0.9).  To follow up this abnormal CT scan, the patient underwent a PET  CT scan on January 24, 2007.  The nodular opacity identified on CT scan  did not light up as hypermetabolic on PET.  However, another area of  increased metabolic activity was identified in the right hilum.  There  was no other sign of suspicious abnormality in the chest or abdomen, and  the remaining abdominal CT scan appears unremarkable with no areas to  suggest distant metastatic disease.  The patient has been referred for  thoracic surgical consultation.   REVIEW OF SYSTEMS:  GENERAL:  The patient feels well.  His appetite is  good.  He has gained a few pounds over the last few months.  His energy  level is good.  CARDIAC:  The patient denies any chest  pain, chest tightness, chest  pressure either with activity or at rest.  RESPIRATORY:  Negative.  The patient denies productive cough,  hemoptysis, wheezing.  He has no pleuritic chest pain.  He has no  history of trauma to the chest.  GASTROINTESTINAL:  Negative.  The patient reports normal bowel function.  He denies hematochezia, hematemesis, or melena.  GENITOURINARY:  Negative.  MUSCULOSKELETAL:  Negative.  HEENT:  Negative.   PAST MEDICAL HISTORY:  1. Colon cancer, status post right laparoscopic colectomy, August      2008, for T2 N0 moderately differentiated adenocarcinoma of the      colon.  2. Remote history of tobacco use (quit 38 years ago).   SOCIAL HISTORY:  The patient is retired.  He had previously worked at  [employer] in [employer].  He is married and lives  with his wife.  They have 1 grown child.  He denies alcohol consumption.  He has a remote history of tobacco use.   FAMILY HISTORY:  Notable for the absence of any family members with lung  cancer.  The patient's mother did have cancer but she died in her 32s.   CURRENT MEDICATIONS:  None.   DRUG ALLERGIES:  Advil.  PHYSICAL EXAMINATION:  General:  The patient is a thin, well-appearing  male who appears somewhat younger than stated age in no acute distress.  Vital signs:  Blood pressure is 129/73, pulse 68, oxygen saturation 99%  on room air.  HEENT:  Unrevealing.  There is no palpable cervical or  supraclavicular lymphadenopathy.  There is no jugular venous distention.  CHEST:  No carotid bruits are noted.  Auscultation of the chest  demonstrates clear breath sounds which are symmetrical bilaterally.  No  wheezes or rhonchi noted.  Cardiovascular:  Notable for a regular rate  and rhythm.  No murmurs, rubs, or gallops are appreciated.  Abdomen:  Flat, soft, nontender.  There is a well healed small scar in  the mid  abdomen from previous laparoscopic colectomy.  There are no palpable   masses.  Bowel sounds are present.  Extremities:  Warm and well  perfused.  There is no lower extremity edema.  Distal pulses are  palpable in both lower legs at the ankle.  Rectal:  Deferred.  GU:  Deferred.  Neurologic:  Grossly nonfocal.   DIAGNOSTIC TESTS:  Chest CT scan performed on January 01, 2007, is  reviewed.  This demonstrates a small (11-mm) nodular opacity that is  located peripherally in the right lower lobe.  This nodular opacity does  not have any calcifications nor spiculated appearance.  It is large  enough that if benign one would suspect that a small nodule would have  been appreciated on chest x-ray performed in August 2008, although this  is not necessarily the case.  No other pulmonary parenchymal lesions are  noted.  There are no pleural effusions.   PET CT scan performed January 24, 2007, is reviewed.  The lesion seen on  CT scan does not appear to be hypermetabolic and does not have increased  tracer uptake.  However, there is an area of increased tracer uptake in  the right hilum.  There is some vague slightly increased activity in the  left hilum as well.  There are no enlarged mediastinal lymph nodes.  The  right hilar lesion would not be reachable with mediastinoscopy as it is  well down into the right lung parenchyma and likely represents an area  of interlobar lymph node.   IMPRESSION:  Rounded soft tissue nodule in the periphery of the right  lung which was not seen on chest x-ray in August 2008, but also does not  appear to be hypermetabolic on recent PET scan.  This lesion would be  easily biopsied using videothoracoscopic techniques.  The area of  increased tracer uptake near the right hilum appears to be an interlobar  lymph node which would not be readily accessible via mediastinoscopy but  rather would require open thoracoscopic or miniature thoracotomy  approach for definitive biopsy.  At this point, it is still conceivable  these could be  related to inflammatory changes.  Given the patient's low  CEA level with his early stage colon cancer, I suspect that metastatic  colon cancer is not very likely.  If this does represent a malignant  process, I would be suspicious of primary lung cancer, perhaps small  cell.  However, there is sufficient possibility this may all be entirely  benign.  Alternatives include close observation, versus proceeding  directly to videothoracoscopic surgery for definitive biopsy.   PLAN:  I have discussed matters at length with the patient and his wife.  The relative risks and benefits of close observation versus  proceeding  to surgery for biopsy have been discussed.  All their questions have  been addressed.  The patient desires to think things over for a couple  of days before making a decision.  If he desires to be aggressive, we  could proceed with surgery at any time.  He appears to have excellent  baseline pulmonary function parameters on bedside spirometry here in our  office today.  On the other hand, it would not be unreasonable to simply  repeat a scan in 6-8 weeks as a less aggressive alternative.   Salvatore Decent. Cornelius Moras, M.D.  Electronically Signed   CHO/MEDQ  D:  02/03/2007  T:  02/03/2007  Job:  [id number]   cc:   Vicente Serene I. Odogwu, M.D.  Quita Skye Artis Flock, M.D.  Currie Paris, M.D.  Wilhemina Bonito. Marina Goodell, MD

## 2010-05-30 NOTE — Assessment & Plan Note (Signed)
OFFICE VISIT   Brett Santiago, Brett Santiago  DOB:  [birth year]                                        March 21, 2007  CHART #:  [id number]   CURRENT PROBLEMS:  1. Status post right video-assisted thoracoscopic surgery with wedge      resection of right upper lobe nodule (hamartoma) and biopsy of      hilar node (benign).  2. History of colon cancer in August, 2008, colectomy.  3. Remote history of smoking.   PRESENT ILLNESS:  Patient returns for his first postop visit after  undergoing a right VATS and wedge resection of a right upper lobe  nodule.  This was a hamartoma.  Right hilar lymph nodes were biopsied  and both were anthracotic with inflammation and no malignancy.  The  patient is recovering well from the surgery with some residual  incisional soreness.  He has no cough, shortness of breath, and he has a  good range of motion of his right upper extremity.  He is not taking any  narcotic pain medications any longer.  He denies any difficulty with the  surgical incisions.   CURRENT MEDICATIONS:  He is currently taking only hydrocodone and a  vitamin.   PHYSICAL EXAMINATION:  Blood pressure 130/70, pulse 97 and regular,  respirations 18, saturation 99%.  He is alert and comfortable.  His  breath sounds are clear and equal.  The VATS incisions are healing well.  Cardiac rhythm is regular.  Neurologic exam is intact.  There is no  peripheral edema.   Chest x-ray, PA and lateral, taken today shows significant reversal of  most of the postoperative changes in his right lung field; however,  there is still a small area of atelectasis in the area of the fissure.  There is no significant pleural effusion and no pneumothorax.   IMPRESSION/PLAN:  Patient was told that he is recovering very nicely  following his video-assisted thoracoscopic surgery to remove the benign  nodule.  He did not want any more narcotics, but I did give him a  prescription for some Ultram for  less severe pain.  He knows he can  resume driving and light activities but to avoid heavy lifting or heavy  exertional activity for another three weeks.  He will be followed back  in three weeks with a chest x-ray for his final postop visit.   Kerin Perna, M.D.  Electronically Signed   PV/MEDQ  D:  03/21/2007  T:  03/21/2007  Job:  [id number]   cc:   Quita Skye. Artis Flock, M.D.

## 2010-05-30 NOTE — Assessment & Plan Note (Signed)
[facility]                         GASTROENTEROLOGY OFFICE NOTE   Brett Santiago, Brett Santiago                         MRN:          [id number]  DATE:08/13/2006                            DOB:          1929/05/25    REASON FOR EVALUATION:  Recently diagnosed colon cancer.   HISTORY:  This is a 77 year old white male with no significant medical  history.  He was referred through the courtesy of Dr. Artis Flock for  screening colonoscopy to evaluate Hemoccult positive stool.  Complete  colonoscopy was performed August 08, 2006.  The patient was found to have  a 2.5 cm ulcerative lesion in the proximal ascending colon.  Biopsies  revealed adenocarcinoma.  He was also noted to have a sessile  hyperplastic colon polyp in the distal ascending colon.  This was  removed.  Finally, a larger adenomatous polyp in the sigmoid colon, also  resected.  A CT scan of the abdomen and pelvis was ordered and completed  August 09, 2006.  The patient was noted to have a nonspecific 10 mm lesion  in the lateral left hepatic segment.  No other abnormalities noted.  He  is status post appendectomy. He presents today for followup.  I have  reviewed his CT scan results with him as well as his colonoscopy and  pathology.  On review the patient denies problems with abdominal pain or  fatigue. He has however had a 15 pound weight loss over the past 6  months.   PAST MEDICAL HISTORY:  None.   PAST SURGICAL HISTORY:  Appendectomy.   ALLERGIES:  Intolerant to ADVIL.   CURRENT MEDICATIONS:  1. Multivitamin.  2. Gingko supplement.   FAMILY HISTORY:  Negative for gastrointestinal malignancy.   SOCIAL HISTORY:  The patient is married.  He is accompanied by his wife.  He does not smoke or use alcohol.   REVIEW OF SYSTEMS:  Per diagnostic evaluation form.   PHYSICAL EXAMINATION:  A well-appearing male in no acute distress.  Blood pressure is 130/50, heart rate is 64 and regular.  Weight is  165.2  pounds.  He is 6 feet in height.  HEENT:  Sclerae anicteric.  Conjunctivae are pink.  Oral mucosa is  intact.  No adenopathy.  LUNGS:  Clear.  HEART:  Regular.  ABDOMEN:  Soft without tenderness, mass or hernia.  Good bowel sounds  heard.  EXTREMITIES:  Without edema.   LABORATORIES:  Laboratories obtained today have returned.  They are for  the most part unremarkable.  His hemoglobin is normal at 13.4.  Coagulation studies are normal.  Liver function studies are normal.  CEA  level also normal at 0.7.   IMPRESSION:  This is a 77 year old gentleman with ascending colon  cancer.  No obvious evidence of metastatic disease, though questionable  lesion in left liver as discussed above.   RECOMMENDATIONS:  1. Laboratory is obtained.  Results as above.  2. Refer to Dr. Cyndia Bent for right hemicolectomy with      intraoperative evaluation of the liver.  3. I discussed the patient's case with his primary care physician  Dr.      Artis Flock, with whom he will follow up for his general medical care.  4. Anticipate surveillance colonoscopy in 1 year.     Wilhemina Bonito. Marina Goodell, MD  Electronically Signed    JNP/MedQ  DD: 08/13/2006  DT: 08/14/2006  Job #: [id number]   cc:   Quita Skye. Artis Flock, M.D.  Currie Paris, M.D.

## 2010-05-30 NOTE — Assessment & Plan Note (Signed)
OFFICE VISIT   FREDY, GLADU  DOB:  [birth year]                                        October 06, 2007  CHART #:  [id number]   HISTORY OF PRESENT ILLNESS:  The patient returns for followup now  approximately 6 months status post right VATS for wedge resection of  right lung nodule, which turned out to be benign hamartoma.  He was last  seen here in the office on April 14, 2007.  Since then, he has done  well.  He reports no new problems or complaints.  He has intermittent  very mild residual soreness in the right lateral chest wall consistent  with his previous thoracotomy.  The patient has no shortness of breath.  The patient reports that intermittent cough productive of a small amount  of thin sputum.  He denies purulent sputum production or hemoptysis.  He  is otherwise doing quite well and the remainder of his review of systems  is unremarkable.  The remainder of his past medical history is  unchanged.  He is not taking any medications.   PHYSICAL EXAMINATION:  GENERAL:  Notable for well-appearing thin white  male.  VITAL SIGNS:  Blood pressure 131/66, pulse 56, and oxygen saturation 98%  on room air.  HEENT:  Unrevealing.  There is no palpable lymphadenopathy.  CHEST:  Auscultation of chest demonstrates clear breath sounds, which  are symmetrical bilaterally.  No wheezes or rhonchi are noted.  CARDIOVASCULAR:  Regular rate and rhythm.  No murmurs, rubs, or gallops  are noted.  ABDOMEN:  Soft and nontender.  EXTREMITIES:  Warm and well perfused.   DIAGNOSTIC TEST:  Chest CT scan performed today at the [facility] is reviewed.  This demonstrates that the previous nodule  located in the superior segment of the right lower lobe has been removed  surgically.  There is some minor scar tissue in this region consistent  with the patient's previous surgery.  No other pulmonary parenchymal  lesions are noted.  There are no pleural  effusions.   IMPRESSION:  The patient is doing well now 6 months following right  video-assisted thoracoscopic surgery for wedge resection of benign  pulmonary hamartoma.  He has a minor amount of residual scar tissue in  the right lung consistent with his surgery and this will serve as a new  baseline for any future radiographic studies.   PLAN:  The patient will call and return to see [place] in the future only  should further problems or difficulties arise.  All of his questions  have been addressed.   Salvatore Decent. Cornelius Moras, M.D.  Electronically Signed   CHO/MEDQ  D:  10/06/2007  T:  10/06/2007  Job:  [id number]   cc:   Quita Skye. Artis Flock, M.D.  Lauretta I. Odogwu, M.D.  Currie Paris, M.D.  Wilhemina Bonito. Marina Goodell, MD

## 2010-05-30 NOTE — Assessment & Plan Note (Signed)
OFFICE VISIT   Brett Santiago, Brett Santiago  DOB:  1929-05-27                                        April 14, 2007  CHART #:  [id number]   HISTORY OF PRESENT ILLNESS:  Brett Santiago returns for routine followup,  status post right VATS for wedge resection of right upper lobe benign  hamartoma with biopsy of benign right hilar lymph node.  He was last  seen here in the office on March 21, 2007.  Since then, he has continued  to do well.  Brett Santiago reports that he has no residual complaints of  the event.  He still has some mild numbness and hyperesthesia in the  right anterior chest wall consistent with his recent miniature  thoracotomy incision and intercostal nerve praxia.  He has no shortness  of breath.  He has no cough.  He otherwise feels well and is back to  normal activity.  The remainder of his review of systems is unrevealing.  The remainder of his past medical history is unchanged.   PHYSICAL EXAMINATION:  GENERAL:  Notable for well-appearing male.  VITAL SIGNS:  Blood pressure is 118/68.  Oxygen saturation is 98% on  room air.  Pulse is 68 and regular.  CHEST:  Auscultation of the chest demonstrates clear breath sounds which  are symmetrical bilaterally.  The miniature right thoracotomy incision  has healed nicely.  CARDIOVASCULAR:  Reveals regular rate and rhythm.  No other  abnormalities are noted.  The remainder of his physical exam is noncontributory.   DIAGNOSTIC TESTS:  Chest x-ray obtained today at the [facility] is reviewed.  This demonstrates continued resolution of streaky  right lung opacity related to the patient's recent surgery.  The lung  fields are otherwise clear, and no other abnormalities are noted.   IMPRESSION:  Satisfactory progress following right video-assisted  thoracic surgery for a wedge resection of benign hamartoma and biopsy of  right hilar lymph node.  Brett Santiago is doing quite well.  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  These symptoms continue to resolve uneventfully, and he is  not requiring any medications.   PLAN:  We will have Brett Santiago return for followup in 6 months with a  chest CT scan at that time.  He is released to normal physical activity.   Salvatore Decent. Cornelius Moras, M.D.  Electronically Signed   CHO/MEDQ  D:  04/14/2007  T:  04/14/2007  Job:  [id number]   cc:   Vicente Serene I. Odogwu, M.D.  Quita Skye Artis Flock, M.D.  Currie Paris, M.D.  Wilhemina Bonito. Marina Goodell, MD

## 2010-05-30 NOTE — Discharge Summary (Signed)
NAME:  Brett Santiago, Brett Santiago NO.:  [id number]   MEDICAL RECORD NO.:  [id number]          PATIENT TYPE:  INP   LOCATION:  2006                         FACILITY:  [facility]   PHYSICIAN:  Salvatore Decent. Cornelius Moras, M.D. DATE OF BIRTH:  21-Jan-1929   DATE OF ADMISSION:  02/18/2007  DATE OF DISCHARGE:  02/23/2007                               DISCHARGE SUMMARY   FINAL DIAGNOSIS:  Right lung nodule, hypermetabolic right hilar lymph  node, pulmonary hamartoma, no evidence of malignancy.   SECONDARY DIAGNOSES:  1. History of colon cancer status post right laparoscopic colectomy      August 2008 for a T2 N0 moderately differentiated adenocarcinoma of      the colon.  2. History of tobacco use, quit 38 years ago.   IN-HOSPITAL OPERATIONS AND PROCEDURES:  Right video-assisted  thoracoscopic surgery for wedge resection right lung nodule and open  thoracoscopic biopsy of right hilar lymph node.   HISTORY AND PHYSICAL AND HOSPITAL COURSE:  The patient is a 77 year old  gentleman who has been healthy most of his life.  In August 2008 he  underwent right colectomy for node-negative adenocarcinoma of the colon.  He was seen in follow-up by Dr. Arlan Organ from [facility].  Routine screening CT scan of chest, abdomen and pelvis was  performed in December 2008.  CT scan revealed suspicious nodule in the  right lung.  A PET scan was done showing no hypermetabolic uptake in the  lung nodule but hypermetabolic lymph node was noted in the right hilum.  The patient was referred for thoracic surgical consultation.  Dr. Cornelius Moras  discussed with the patient undergoing right video-assisted thoracoscopic  surgery with wedge resection of the nodule and resection of the right  hilar lymph node.  He discussed risks and benefits with the patient.  The patient acknowledged understanding and agreed to proceed.  Surgery  was scheduled for February 18, 2007.  For details of the patient's past  medical  history and physical exam, please see dictated H&P.   The patient was taken to the operating room on February 18, 2007, where  he underwent right video-assisted thoracoscopic surgery for wedge  resection right lung nodule and open thoracoscopic biopsy of right hilar  lymph node.  The patient tolerated this procedure and was transferred to  the intensive care unit in stable condition.  Postoperatively, the  patient was noted to be hemodynamically stable.  He was able to be  extubated following surgery.  Post extubation the patient noted to be  alert and oriented x4 and neuro intact.  Post extubation the patient was  placed on nasal cannula.  O2 saturations were stable on 3 L.  Follow-up  chest x-ray postoperatively with stable mild atelectasis right lower  lobe.  The patient had minimum drainage from chest tube.  Repeat chest x-  ray postoperative day #1 was stable with no pneumothorax.  The patient  did have a small air leak and chest tube remained on suction.  The  following day the air leak improved and chest tube was placed to water  seal.  Chest x-ray still showed no pneumothorax.  Postoperative day #3  the patient had no air leak with a questionable small pneumothorax on  the right side.  He had no drainage from chest tube and chest tube was  discontinued in a normal fashion postoperative day #3.  Follow-up chest  x-ray post removal of chest tube showed an increased size in the  pneumothorax, about 15%.  The patient was asymptomatic.  His saturations  were greater than 90% on room air.  Repeat chest x-ray later that  afternoon showed stable 15% right pneumothorax.  Plan to repeat PA and  lateral chest x-ray in a.m. on February 22, 2007, for further evaluation.  The patient's pathology report came back showing no malignant evident.  The right lower lobe nodule was benign hamartoma, and the hilar lymph  node was also noted to be benign.  During the patient's postoperative  course vital  signs were followed closely.  He was afebrile.  Blood  pressure and heart rate were stable.  He was able to be weaned off  oxygen saturating greater than 90% on room air.  The patient remained in  normal sinus rhythm.  He continued to use his incentive spirometer.  All  incisions were clean, dry, intact and healing well.  The patient was out  of bed ambulating well without difficulty.  He was tolerating diet well.  No nausea or vomiting noted.   The patient is tentatively ready for discharge home in the a.m. of  February 22, 2007, pending his PA and lateral chest x-ray remained  stable.   FOLLOW-UP APPOINTMENTS:  Follow-up appointments arranged for Dr. Donata Clay to see the patient March 21, 2007, at 2 p.m..  The patient will  need to obtain PA and lateral chest x-ray 30 minutes prior to this  appointment.  The patient will need to see the nurse February 28, 2007,  at 10 a.m. for suture removal.   ACTIVITY:  The patient instructed no driving until released to do so, no  heavy lifting over 10 pounds.  He was told to ambulate three to four  times per day, progress as tolerated, and to continue his breathing  exercises.   INCISIONAL CARE:  The patient is told to shower, washing his incisions  using soap and water.  He is to contact the office if he develops any  drainage or opening from any of his incision sites.   DIET:  The patient told he can eat a diet to be low-fat, low-salt.   DISCHARGE MEDICATIONS:  1. Daily vitamin and mineral.  2. Percocet 5/325 one to two tablets q.3-4h. p.r.n.      Theda Belfast, PA      Salvatore Decent. Cornelius Moras, M.D.  Electronically Signed    KMD/MEDQ  D:  02/21/2007  T:  02/23/2007  Job:  [id number]

## 2010-10-05 LAB — BLOOD GAS, ARTERIAL
Acid-Base Excess: 2.7 — ABNORMAL HIGH
Bicarbonate: 27 — ABNORMAL HIGH
Patient temperature: 98.6
TCO2: 28.3
pH, Arterial: 7.411

## 2010-10-05 LAB — COMPREHENSIVE METABOLIC PANEL
ALT: 17
AST: 19
Albumin: 3.7
BUN: 11
Calcium: 9
Chloride: 106
Creatinine, Ser: 0.85
GFR calc non Af Amer: >60
Glucose, Bld: 95
Potassium: 4.2
Total Protein: 5.9 — ABNORMAL LOW

## 2010-10-05 LAB — APTT: aPTT: 32

## 2010-10-05 LAB — URINALYSIS, ROUTINE W REFLEX MICROSCOPIC
Glucose, UA: NEGATIVE
Nitrite: NEGATIVE
Specific Gravity, Urine: 1.019
pH: 7

## 2010-10-05 LAB — ABO/RH: ABO/RH(D): A NEG

## 2010-10-05 LAB — CBC
HCT: 39.5
Hemoglobin: 13.5
MCV: 86.8
RDW: 13.4
WBC: 4.9

## 2010-10-05 LAB — TYPE AND SCREEN
ABO/RH(D): A NEG
Antibody Screen: NEGATIVE

## 2010-10-06 LAB — BASIC METABOLIC PANEL
BUN: 11
Chloride: 102
GFR calc non Af Amer: >60
Glucose, Bld: 178 — ABNORMAL HIGH
Potassium: 4
Sodium: 135

## 2010-10-06 LAB — BLOOD GAS, ARTERIAL
Acid-base deficit: 2.8 — ABNORMAL HIGH
Bicarbonate: 21.6
TCO2: 22.7
pCO2 arterial: 37.4
pH, Arterial: 7.378

## 2010-10-06 LAB — COMPREHENSIVE METABOLIC PANEL
AST: 34
Albumin: 2.9 — ABNORMAL LOW
Alkaline Phosphatase: 39
BUN: 13
Creatinine, Ser: 0.95
GFR calc Af Amer: >60
Potassium: 3.8
Total Protein: 5.3 — ABNORMAL LOW

## 2010-10-06 LAB — CBC
HCT: 35.2 — ABNORMAL LOW
HCT: 37 — ABNORMAL LOW
Hemoglobin: 12.9 — ABNORMAL LOW
MCV: 86.2
Platelets: 140 — ABNORMAL LOW
Platelets: 161
RDW: 13
WBC: 9.9

## 2010-10-06 LAB — TISSUE CULTURE
Culture: NO GROWTH
Gram Stain: NONE SEEN

## 2010-10-06 LAB — FUNGUS CULTURE W SMEAR: Fungal Smear: NONE SEEN

## 2010-10-10 ENCOUNTER — Encounter (HOSPITAL_COMMUNITY): Payer: Medicare Other | Attending: Internal Medicine

## 2010-10-10 DIAGNOSIS — M810 Age-related osteoporosis without current pathological fracture: Secondary | ICD-10-CM | POA: Insufficient documentation

## 2010-10-30 LAB — CBC
HCT: 49.8
MCHC: 34.4
MCV: 84.4
MCV: 89.9
Platelets: 287
RBC: 5.54
WBC: 4.5

## 2010-10-30 LAB — BASIC METABOLIC PANEL
BUN: 11
CO2: 30
Chloride: 96
Creatinine, Ser: 0.9
Glucose, Bld: 159 — ABNORMAL HIGH

## 2010-10-30 LAB — URINALYSIS, ROUTINE W REFLEX MICROSCOPIC
Glucose, UA: NEGATIVE
Hgb urine dipstick: NEGATIVE
Specific Gravity, Urine: 1.022

## 2011-08-31 ENCOUNTER — Encounter: Payer: Self-pay | Admitting: Internal Medicine

## 2011-09-10 ENCOUNTER — Ambulatory Visit (AMBULATORY_SURGERY_CENTER): Payer: Medicare Other | Admitting: *Deleted

## 2011-09-10 VITALS — Ht 72.0 in | Wt 166.0 lb

## 2011-09-10 DIAGNOSIS — Z86010 Personal history of colonic polyps: Secondary | ICD-10-CM

## 2011-09-10 DIAGNOSIS — Z85038 Personal history of other malignant neoplasm of large intestine: Secondary | ICD-10-CM

## 2011-09-10 DIAGNOSIS — Z1211 Encounter for screening for malignant neoplasm of colon: Secondary | ICD-10-CM

## 2011-09-10 MED ORDER — MOVIPREP 100 G PO SOLR: 1.0000 | Freq: Once | ORAL | Status: DC

## 2011-09-27 ENCOUNTER — Ambulatory Visit (AMBULATORY_SURGERY_CENTER): Payer: Medicare Other | Admitting: Internal Medicine

## 2011-09-27 ENCOUNTER — Encounter: Payer: Self-pay | Admitting: Internal Medicine

## 2011-09-27 VITALS — BP 158/81 | HR 71 | Temp 97.9°F | Resp 14 | Ht 72.0 in | Wt 166.0 lb

## 2011-09-27 DIAGNOSIS — Z85038 Personal history of other malignant neoplasm of large intestine: Secondary | ICD-10-CM

## 2011-09-27 DIAGNOSIS — Z86010 Personal history of colonic polyps: Secondary | ICD-10-CM

## 2011-09-27 DIAGNOSIS — Z1211 Encounter for screening for malignant neoplasm of colon: Secondary | ICD-10-CM

## 2011-09-27 DIAGNOSIS — L0390 Cellulitis, unspecified: Secondary | ICD-10-CM

## 2011-09-27 MED ORDER — SODIUM CHLORIDE 0.9 % IV SOLN: 500.0000 mL | INTRAVENOUS | Status: DC

## 2011-09-27 MED ORDER — AMOXICILLIN-POT CLAVULANATE 875-125 MG PO TABS: 1.0000 | ORAL_TABLET | Freq: Two times a day (BID) | ORAL | Status: AC

## 2011-09-27 NOTE — Progress Notes (Signed)
Inner surface left forearm and upper arm slightly swollen, pink and warm to touch.  Pt. States he had an injection "a couple of weeks ago" Somewhere in this arm.  He is unable to articulate where the injection was placed or why he got it.   Spoke with pt. Wife about this area.

## 2011-09-27 NOTE — Progress Notes (Signed)
Patient did not have preoperative order for IV antibiotic SSI prophylaxis. (G8918)  Patient did not experience any of the following events: a burn prior to discharge; a fall within the facility; wrong site/side/patient/procedure/implant event; or a hospital transfer or hospital admission upon discharge from the facility. (G8907)  

## 2011-09-27 NOTE — Patient Instructions (Addendum)
YOU HAD AN ENDOSCOPIC PROCEDURE TODAY AT THE [facility]: Refer to the procedure report that was given to you for any specific questions about what was found during the examination.  If the procedure report does not answer your questions, please call your gastroenterologist to clarify.  If you requested that your care partner not be given the details of your procedure findings, then the procedure report has been included in a sealed envelope for you to review at your convenience later.  YOU SHOULD EXPECT: Some feelings of bloating in the abdomen. Passage of more gas than usual.  Walking can help get rid of the air that was put into your GI tract during the procedure and reduce the bloating. If you had a lower endoscopy (such as a colonoscopy or flexible sigmoidoscopy) you may notice spotting of blood in your stool or on the toilet paper. If you underwent a bowel prep for your procedure, then you may not have a normal bowel movement for a few days.  DIET: Your first meal following the procedure should be a light meal and then it is ok to progress to your normal diet.  A half-sandwich or bowl of soup is an example of a good first meal.  Heavy or fried foods are harder to digest and may make you feel nauseous or bloated.  Likewise meals heavy in dairy and vegetables can cause extra gas to form and this can also increase the bloating.  Drink plenty of fluids but you should avoid alcoholic beverages for 24 hours.  ACTIVITY: Your care partner should take you home directly after the procedure.  You should plan to take it easy, moving slowly for the rest of the day.  You can resume normal activity the day after the procedure however you should NOT DRIVE or use heavy machinery for 24 hours (because of the sedation medicines used during the test).    SYMPTOMS TO REPORT IMMEDIATELY: A gastroenterologist can be reached at any hour.  During normal business hours, 8:30 AM to 5:00 PM Monday through Friday,  call [phone].  After hours and on weekends, please call the GI answering service at [phone] who will take a message and have the physician on call contact you.   Following lower endoscopy (colonoscopy or flexible sigmoidoscopy):  Excessive amounts of blood in the stool  Significant tenderness or worsening of abdominal pains  Swelling of the abdomen that is new, acute  Fever of 100F or higher  FOLLOW UP: If any biopsies were taken you will be contacted by phone or by letter within the next 1-3 weeks.  Call your gastroenterologist if you have not heard about the biopsies in 3 weeks.  Our staff will call the home number listed on your records the next business day following your procedure to check on you and address any questions or concerns that you may have at that time regarding the information given to you following your procedure. This is a courtesy call and so if there is no answer at the home number and we have not heard from you through the emergency physician on call, we will assume that you have returned to your regular daily activities without incident.  SIGNATURES/CONFIDENTIALITY: You and/or your care partner have signed paperwork which will be entered into your electronic medical record.  These signatures attest to the fact that that the information above on your After Visit Summary has been reviewed and is understood.  Full responsibility of the confidentiality of this   discharge information lies with you and/or your care-partner.   Thank-you for choosing us for your healthcare needs. 

## 2011-09-27 NOTE — Progress Notes (Addendum)
INFORMED DR. PERRY AND SHELIA CAMP CRNA OF REDNESS OF LEFT UPPER ARM. DR. Marina Goodell EXAMINED PATIENT'S LEFT ARM  PRIOR TO TIME OUT.

## 2011-09-27 NOTE — Progress Notes (Signed)
Dr. Marina Goodell looked at the L arm again in recovery.   He outlined the area with a black magic marker.   The patient had had a pnuemonia injection in that arm the day before yesterday at his PCP's office.   Dr. Arlys Hollie MacKenzie's office was notified.  Dr. Marina Goodell has ordered Augmentin 875mg  BID for 14 days.   That script went to [facility].    The patient has an appointment to see PCP at 1000am tomorrow.

## 2011-09-27 NOTE — Op Note (Signed)
 [facility] [address] [place], [address]   COLONOSCOPY PROCEDURE REPORT  PATIENT: Brett, Santiago  MR#: [id number] BIRTHDATE: [birth year] , 82  yrs. old GENDER: Male ENDOSCOPIST: Roxy Cedar, MD REFERRED [id number] Program Recall PROCEDURE DATE:  09/27/2011 PROCEDURE:   Colonoscopy, surveillance ASA CLASS:   Class II INDICATIONS:high risk patient with personal history of colon cancer.  MEDICATIONS: MAC sedation, administered by CRNA and propofol (Diprivan) 150mg  IV  DESCRIPTION OF PROCEDURE:   After the risks benefits and alternatives of the procedure were thoroughly explained, informed consent was obtained.  A digital rectal exam revealed no abnormalities of the rectum.   The LB CF-H180AL [id number]  endoscope was introduced through the anus and advanced to the surgical anastomosis. No adverse events experienced.   The quality of the prep was excellent, using MoviPrep  The instrument was then slowly withdrawn as the colon was fully examined.      COLON FINDINGS: A normal appearing anastomosis.  The residual ascending, hepatic flexure, transverse, splenic flexure, descending, sigmoid colon and rectum appeared unremarkable.  No polyps or cancers were seen.  Retroflexed views revealed internal hemorrhoids. The time to cecum=4 minutes 17 seconds.  Withdrawal time=5 minutes 15 seconds.  The scope was withdrawn and the procedure completed. COMPLICATIONS: There were no complications.  ENDOSCOPIC IMPRESSION: 1. Normal colon s/p right hemicolectomy 2. MODERATE CELLULITIS LEFT ARM "(HAD INJECTION OF ANTIBIOTICS FOR PNEUMONIA 2 WEEKS AGO IN THAT ARM..."). NO PAIN OR FEVER. LOOKS WELL. AREA MARKED WITH PEN  RECOMMENDATIONS: 1. PRESCRIBE AUGMENTIN 875 MG BID; #14; NO REFILLS (FOR CELLULITIS) 2 .Return to the care of your primary provider TOMORROW TO REASSESS YOUR ARM. TO ER IF RAPID PROGRESSION. WE CALLED THAT OFFICE 3. GI follow up as  needed   eSigned:  Roxy Cedar, MD 09/27/2011 3:40 PM   cc: Shary Decamp MD, Arlan Organ, MD, and The Patient

## 2011-09-28 ENCOUNTER — Telehealth: Payer: Self-pay | Admitting: *Deleted

## 2011-09-28 NOTE — Telephone Encounter (Signed)
  Follow up Call-  Call back number 09/27/2011  Post procedure Call Back phone  # [phone]  Permission to leave phone message Yes     Patient questions:  Do you have a fever, pain , or abdominal swelling? no Pain Score  0 *  Have you tolerated food without any problems? yes  Have you been able to return to your normal activities? yes  Do you have any questions about your discharge instructions: Diet   no Medications  no Follow up visit  no  Do you have questions or concerns about your Care? no  Actions: * If pain score is 4 or above: No action needed, pain <4.  Pt. Advised that he has started Augmentin prescribed yesterday for cellulitis left arm.  He is to see his primary care provider today for re-examination of arm.  States Arm is less tender and swollen today.

## 2014-07-02 ENCOUNTER — Encounter: Payer: Self-pay | Admitting: Internal Medicine

## 2016-02-15 ENCOUNTER — Emergency Department (HOSPITAL_COMMUNITY): Payer: Medicare Other

## 2016-02-15 ENCOUNTER — Observation Stay (HOSPITAL_COMMUNITY)
Admission: EM | Admit: 2016-02-15 | Discharge: 2016-02-17 | Disposition: A | Discharge status: Home / Self Care | Payer: Medicare Other | Attending: Internal Medicine | Admitting: Internal Medicine

## 2016-02-15 ENCOUNTER — Encounter (HOSPITAL_COMMUNITY): Payer: Self-pay | Admitting: Emergency Medicine

## 2016-02-15 DIAGNOSIS — Z87891 Personal history of nicotine dependence: Secondary | ICD-10-CM | POA: Diagnosis not present

## 2016-02-15 DIAGNOSIS — L8990 Pressure ulcer of unspecified site, unspecified stage: Secondary | ICD-10-CM | POA: Insufficient documentation

## 2016-02-15 DIAGNOSIS — F0150 Vascular dementia without behavioral disturbance: Secondary | ICD-10-CM

## 2016-02-15 DIAGNOSIS — Z9889 Other specified postprocedural states: Secondary | ICD-10-CM | POA: Insufficient documentation

## 2016-02-15 DIAGNOSIS — R531 Weakness: Secondary | ICD-10-CM

## 2016-02-15 DIAGNOSIS — R948 Abnormal results of function studies of other organs and systems: Secondary | ICD-10-CM | POA: Insufficient documentation

## 2016-02-15 DIAGNOSIS — Z85038 Personal history of other malignant neoplasm of large intestine: Secondary | ICD-10-CM | POA: Insufficient documentation

## 2016-02-15 DIAGNOSIS — Z79899 Other long term (current) drug therapy: Secondary | ICD-10-CM | POA: Diagnosis not present

## 2016-02-15 DIAGNOSIS — J90 Pleural effusion, not elsewhere classified: Secondary | ICD-10-CM | POA: Diagnosis not present

## 2016-02-15 DIAGNOSIS — K8020 Calculus of gallbladder without cholecystitis without obstruction: Secondary | ICD-10-CM | POA: Insufficient documentation

## 2016-02-15 DIAGNOSIS — N400 Enlarged prostate without lower urinary tract symptoms: Secondary | ICD-10-CM | POA: Insufficient documentation

## 2016-02-15 DIAGNOSIS — I517 Cardiomegaly: Secondary | ICD-10-CM | POA: Insufficient documentation

## 2016-02-15 DIAGNOSIS — Z886 Allergy status to analgesic agent status: Secondary | ICD-10-CM | POA: Insufficient documentation

## 2016-02-15 DIAGNOSIS — J189 Pneumonia, unspecified organism: Secondary | ICD-10-CM | POA: Diagnosis not present

## 2016-02-15 LAB — CBC
HCT: 42.5 % (ref 39.0–52.0)
Hemoglobin: 14.5 g/dL (ref 13.0–17.0)
MCH: 30.2 pg (ref 26.0–34.0)
MCHC: 34.1 g/dL (ref 30.0–36.0)
MCV: 88.5 fL (ref 78.0–100.0)
Platelets: 151 10*3/uL (ref 150–400)
RBC: 4.8 MIL/uL (ref 4.22–5.81)
RDW: 12.8 % (ref 11.5–15.5)
WBC: 11.1 10*3/uL — AB (ref 4.0–10.5)

## 2016-02-15 LAB — URINALYSIS, ROUTINE W REFLEX MICROSCOPIC
Bacteria, UA: NONE SEEN
Bilirubin Urine: NEGATIVE
Glucose, UA: NEGATIVE mg/dL
Ketones, ur: NEGATIVE mg/dL
LEUKOCYTES UA: NEGATIVE
Nitrite: NEGATIVE
PROTEIN: NEGATIVE mg/dL
SPECIFIC GRAVITY, URINE: 1.014 (ref 1.005–1.030)
SQUAMOUS EPITHELIAL / LPF: NONE SEEN
pH: 7 (ref 5.0–8.0)

## 2016-02-15 LAB — BASIC METABOLIC PANEL
Anion gap: 10 (ref 5–15)
BUN: 22 mg/dL — AB (ref 6–20)
CALCIUM: 8.9 mg/dL (ref 8.9–10.3)
CHLORIDE: 101 mmol/L (ref 101–111)
CO2: 29 mmol/L (ref 22–32)
Creatinine, Ser: 1.07 mg/dL (ref 0.61–1.24)
GFR calc Af Amer: >60 mL/min (ref >60)
GFR calc non Af Amer: >60 mL/min (ref >60)
Glucose, Bld: 106 mg/dL — ABNORMAL HIGH (ref 65–99)
Potassium: 3.6 mmol/L (ref 3.5–5.1)
SODIUM: 140 mmol/L (ref 135–145)

## 2016-02-15 LAB — INFLUENZA PANEL BY PCR (TYPE A & B)
Influenza A By PCR: NEGATIVE
Influenza B By PCR: NEGATIVE

## 2016-02-15 LAB — I-STAT CG4 LACTIC ACID, ED: LACTIC ACID, VENOUS: 1.87 mmol/L (ref 0.5–1.9)

## 2016-02-15 MED ORDER — SODIUM CHLORIDE 0.9 % IV BOLUS (SEPSIS)
1000.0000 mL | Freq: Once | INTRAVENOUS | Status: AC
  Administered 2016-02-15: 1000 mL via INTRAVENOUS

## 2016-02-15 MED ORDER — SODIUM CHLORIDE 0.9 % IJ SOLN
INTRAMUSCULAR | Status: AC
  Filled 2016-02-15: qty 50

## 2016-02-15 MED ORDER — DEXTROSE 5 % IV SOLN
1.0000 g | Freq: Once | INTRAVENOUS | Status: AC
  Administered 2016-02-15: 1 g via INTRAVENOUS
  Filled 2016-02-15: qty 10

## 2016-02-15 MED ORDER — AZITHROMYCIN 500 MG IV SOLR
500.0000 mg | Freq: Once | INTRAVENOUS | Status: AC
  Administered 2016-02-15: 500 mg via INTRAVENOUS
  Filled 2016-02-15: qty 500

## 2016-02-15 MED ORDER — IOPAMIDOL (ISOVUE-300) INJECTION 61%
100.0000 mL | Freq: Once | INTRAVENOUS | Status: AC | PRN
  Administered 2016-02-15: 100 mL via INTRAVENOUS

## 2016-02-15 MED ORDER — IOPAMIDOL (ISOVUE-300) INJECTION 61%
INTRAVENOUS | Status: AC
  Filled 2016-02-15: qty 100

## 2016-02-15 NOTE — ED Provider Notes (Signed)
[facility] DEPT Provider Note   CSN: [id number] Arrival date & time: 02/15/16  1842     History   Chief Complaint Chief Complaint  Patient presents with  . Fall  . Weakness    HPI Brett Santiago is a 83 y.o. male.  HPI   83 yo M with h/o dementia, colon CA here with generalized weakness. History limited 2/2 dementia. Pt denies complaints. According to son and wife, however, pt was walking earlier today when he became acutely fatigued and nearly fell. Throughout the day, he has been weak and had difficulty walking due ot this weakness. He has also been more confused than usual. He has had a mild cough, as well as nausea and vomiting. He has been unable to eat/drink this afternoon. No known sick contacts.  Level 5 caveat invoked as remainder of history, ROS, and physical exam limited due to patient's dementia.   Past Medical History:  Diagnosis Date  . Colon cancer ([place])   . Dementia   . Memory loss   . Osteoporosis     Patient Active Problem List   Diagnosis Date Noted  . Weakness generalized 02/16/2016  . Vascular dementia without behavioral disturbance   . CAP (community acquired pneumonia) 02/15/2016  . COLON CANCER 04/29/2007  . COLONIC POLYPS 04/29/2007    Past Surgical History:  Procedure Laterality Date  . APPENDECTOMY    . BACK SURGERY  1988  . COLONOSCOPY    . LUNG SURGERY    . POLYPECTOMY    . TONSILLECTOMY     as child       Home Medications    Prior to Admission medications   Medication Sig Start Date End Date Taking? Authorizing Provider  Cholecalciferol (VITAMIN D PO) Take 1 tablet by mouth daily.   Yes Historical Provider, MD  donepezil (ARICEPT) 5 MG tablet Take 1 tablet by mouth Daily. 09/01/11  Yes Historical Provider, MD  Multiple Vitamins-Minerals (MULTIVITAMIN PO) Take 1 tablet by mouth daily.   Yes Historical Provider, MD    Family History Family History  Problem Relation Age of Onset  . Colon cancer Neg Hx     Social  History Social History  Substance Use Topics  . Smoking status: Former Research scientist (life sciences)  . Smokeless tobacco: Never Used  . Alcohol use No     Allergies   Advil [ibuprofen]   Review of Systems Review of Systems  Unable to perform ROS: Dementia     Physical Exam Updated Vital Signs BP 119/66 (BP Location: Right Arm)   Pulse 80   Temp 98.4 F (36.9 C) (Oral)   Resp 20   Ht 6' (1.829 m)   Wt 161 lb 9.6 oz (73.3 kg)   SpO2 96%   BMI 21.92 kg/m   Physical Exam  Constitutional: He is oriented to person, place, and time. He appears well-developed and well-nourished. No distress.  HENT:  Head: Normocephalic and atraumatic.  Mouth/Throat: Oropharynx is clear and moist. No oropharyngeal exudate.  Eyes: Conjunctivae are normal.  Neck: Neck supple.  Cardiovascular: Normal rate, regular rhythm and normal heart sounds.  Exam reveals no friction rub.   No murmur heard. Pulmonary/Chest: Effort normal. No respiratory distress. He has no wheezes. He has rales (bibasilar).  Abdominal: Soft. Bowel sounds are normal. He exhibits no distension. There is tenderness (diffuse, worse in b/l LQ). There is no guarding.  Musculoskeletal: He exhibits no edema.  Neurological: He is alert and oriented to person, place, and time. He exhibits  normal muscle tone.  Skin: Skin is warm. Capillary refill takes less than 2 seconds. No rash noted.  Psychiatric: He has a normal mood and affect.  Nursing note and vitals reviewed.    ED Treatments / Results  Labs (all labs ordered are listed, but only abnormal results are displayed) Labs Reviewed  BASIC METABOLIC PANEL - Abnormal; Notable for the following:       Result Value   Glucose, Bld 106 (*)    BUN 22 (*)    All other components within normal limits  CBC - Abnormal; Notable for the following:    WBC 11.1 (*)    All other components within normal limits  URINALYSIS, ROUTINE W REFLEX MICROSCOPIC - Abnormal; Notable for the following:    APPearance  HAZY (*)    Hgb urine dipstick MODERATE (*)    All other components within normal limits  BASIC METABOLIC PANEL - Abnormal; Notable for the following:    Glucose, Bld 120 (*)    BUN 21 (*)    Calcium 7.9 (*)    GFR calc non Af Amer 58 (*)    All other components within normal limits  CBC - Abnormal; Notable for the following:    WBC 11.6 (*)    RBC 3.84 (*)    Hemoglobin 11.5 (*)    HCT 34.1 (*)    Platelets 137 (*)    All other components within normal limits  URINE CULTURE  CULTURE, BLOOD (ROUTINE X 2)  CULTURE, BLOOD (ROUTINE X 2)  CULTURE, EXPECTORATED SPUTUM-ASSESSMENT  GRAM STAIN  INFLUENZA PANEL BY PCR (TYPE A & B)  STREP PNEUMONIAE URINARY ANTIGEN  LEGIONELLA PNEUMOPHILA SEROGP 1 UR AG  CBC  BASIC METABOLIC PANEL  MAGNESIUM  PSA  TSH  I-STAT CG4 LACTIC ACID, ED    EKG  EKG Interpretation  Date/Time:  Wednesday February 15 2016 19:12:03 EST Ventricular Rate:  102 PR Interval:    QRS Duration: 71 QT Interval:  328 QTC Calculation: 428 R Axis:   61 Text Interpretation:  Sinus tachycardia Nonspecific T abnormalities, lateral leads Since last tracing, rate has increased Confirmed by Ellender Hose MD, Lysbeth Galas [phone]) on 02/16/2016 1:55:20 AM Also confirmed by Ellender Hose MD, Genowefa Morga [phone]), editor [place]  CCT, BEVER[place] ([id number])  on 02/16/2016 7:07:50 AM       Radiology Dg Chest 2 View  Result Date: 02/15/2016 CLINICAL DATA:  Initial evaluation for acute weakness. EXAM: CHEST  2 VIEW COMPARISON:  Prior radiograph from 11/16/2014. FINDINGS: Mild cardiomegaly, stable. Mediastinal silhouette within normal limits. Lungs normally inflated. Surgical clips overlie the right perihilar region. There is patchy infiltrate within the peripheral right lower lobe, concerning for possible pneumonia. Superimposed scarring noted within this region. Mild patchy opacity within the retrocardiac left lower lobe may reflect atelectasis or additional infiltrate. No pulmonary edema. Possible trace right  pleural effusion noted. No pneumothorax. No acute osseous abnormality. IMPRESSION: 1. Patchy infiltrate within the peripheral right lung base, concerning for possible pneumonia. 2. Possible trace right parapneumonic effusion. Electronically Signed   By: Jeannine Boga M.D.   On: 02/15/2016 20:35   Ct Head Wo Contrast  Result Date: 02/15/2016 CLINICAL DATA:  Dementia. Fell this afternoon. Weakness. Unable to walk. EXAM: CT HEAD WITHOUT CONTRAST TECHNIQUE: Contiguous axial images were obtained from the base of the skull through the vertex without intravenous contrast. COMPARISON:  03/06/2005 FINDINGS: Brain: Generalized atrophy. Chronic small-vessel ischemic changes of the cerebral hemispheric white matter. No sign of acute infarction, mass lesion, hemorrhage,  hydrocephalus or extra-axial collection. Vascular: There is atherosclerotic calcification of the major vessels at the base of the brain. Skull: No skull fracture or focal finding. Sinuses/Orbits: Clear/normal Other: None significant IMPRESSION: No acute or traumatic finding. Atrophy and chronic small-vessel ischemic change of the white matter. Electronically Signed   By: Nelson Chimes M.D.   On: 02/15/2016 20:16   Ct Abdomen Pelvis W Contrast  Result Date: 02/15/2016 CLINICAL DATA:  Abdominal pain and vomiting.  Possible fall. EXAM: CT ABDOMEN AND PELVIS WITH CONTRAST TECHNIQUE: Multidetector CT imaging of the abdomen and pelvis was performed using the standard protocol following bolus administration of intravenous contrast. CONTRAST:  154mL ISOVUE-300 IOPAMIDOL (ISOVUE-300) INJECTION 61% COMPARISON:  Chest radiograph earlier this day. CT chest abdomen pelvis 01/01/2007 FINDINGS: Lower chest: Ground-glass opacity in the dependent right lobe has a slightly more confluent component peripherally. Postsurgical change of probable suture material. Minimal dependent ground-glass opacity in the left lower lobe. There is bronchial thickening in the right  greater than left lower lobe. Heart is normal in size. No pleural fluid. Hepatobiliary: Scattered small subcentimeter hepatic hypodensities, similar to prior exam. Small calcified gallstone without abnormal gallbladder distention or inflammation. No biliary dilatation. Pancreas: No ductal dilatation or inflammation. Spleen: Normal in size without focal abnormality. Adrenals/Urinary Tract: No adrenal nodule. No hydronephrosis or perinephric edema. Subcentimeter cyst in the upper left kidney. Urinary bladder is nondistended but thick walled. There is a rounded 18 mm masslike density involving the right anterior bladder with associated calcifications. Small dependent calcification in the right urinary trigone. Fluid density structure adjacent to the right urinary bladder may be a bladder diverticulum, increased in size from prior exam. Mild perivesicular stranding. Stomach/Bowel: Stomach is within normal limits. Appendix is not visualized, surgically absent per report. Descending and sigmoid colon are nondistended, difficult to exclude wall thickening. There is otherwise no bowel wall thickening, inflammation or distention. Vascular/Lymphatic: Aortic and branch atherosclerosis without aneurysm. Calcified and noncalcified atheromatous plaque. No adenopathy. Reproductive: Enlarged prostate gland spanning 6.1 cm. Other: No free air, free fluid, or intra-abdominal fluid collection. Musculoskeletal: Degenerative change in the lumbar spine without evidence of acute fracture. Motion artifact through the iliac crests, no evidence of pelvic fracture. There are no acute or suspicious osseous abnormalities. IMPRESSION: 1. Diffuse urinary bladder wall thickening with mild perivesicular inflammation. Rounded masslike density involving the right anterior bladder with associated calcifications. Recommend correlation with urinalysis to evaluate for urinary tract infection. Cystoscopy recommended to evaluate bladder lesion. Density in  the right urinary trigone be a bladder stone or debris. 2. Decompressed descending and sigmoid colon, difficult to exclude mild wall thickening. 3. Ground-glass opacities in both lower lobes, right greater than left, slightly more confluent in periphery of the right lower lobe. This may reflect pneumonia particularly on the right, however given distribution, aspiration is considered. 4. Chronic incidental findings of atherosclerosis, enlarged prostate gland, and small calcified gallstone. Electronically Signed   By: Jeb Levering M.D.   On: 02/15/2016 22:31    Procedures Procedures (including critical care time)  Medications Ordered in ED Medications  iopamidol (ISOVUE-300) 61 % injection (not administered)  sodium chloride 0.9 % injection (not administered)  donepezil (ARICEPT) tablet 5 mg (5 mg Oral Given 02/16/16 2058)  acetaminophen (TYLENOL) tablet 650 mg (not administered)    Or  acetaminophen (TYLENOL) suppository 650 mg (not administered)  ondansetron (ZOFRAN) tablet 4 mg (not administered)    Or  ondansetron (ZOFRAN) injection 4 mg (not administered)  cefTRIAXone (ROCEPHIN) 1 g in  dextrose 5 % 50 mL IVPB (1 g Intravenous Given 02/16/16 2059)  azithromycin (ZITHROMAX) 500 mg in dextrose 5 % 250 mL IVPB (500 mg Intravenous Given 02/16/16 2100)  enoxaparin (LOVENOX) injection 40 mg (40 mg Subcutaneous Given 02/16/16 1018)  0.9 %  sodium chloride infusion ( Intravenous New Bag/Given 02/16/16 1745)  chlorhexidine (PERIDEX) 0.12 % solution 15 mL (15 mLs Mouth Rinse Given 02/16/16 2058)  MEDLINE mouth rinse (15 mLs Mouth Rinse Given 02/16/16 1600)  QUEtiapine (SEROQUEL) tablet 25 mg (25 mg Oral Given 02/16/16 2058)  tamsulosin (FLOMAX) capsule 0.4 mg (0.4 mg Oral Not Given 02/16/16 1458)  sodium chloride 0.9 % bolus 1,000 mL (0 mLs Intravenous Stopped 02/15/16 2144)  cefTRIAXone (ROCEPHIN) 1 g in dextrose 5 % 50 mL IVPB (0 g Intravenous Stopped 02/15/16 2210)  azithromycin (ZITHROMAX) 500 mg in dextrose  5 % 250 mL IVPB (0 mg Intravenous Stopped 02/15/16 2322)  iopamidol (ISOVUE-300) 61 % injection 100 mL (100 mLs Intravenous Contrast Given 02/15/16 2212)     Initial Impression / Assessment and Plan / ED Course  I have reviewed the triage vital signs and the nursing notes.  Pertinent labs & imaging results that were available during my care of the patient were reviewed by me and considered in my medical decision making (see chart for details).    83 yo M with PMHX as above here with generalized weakness, confusion. On arrival, VSS. Labs as above, remarkable for mild leukocytosis. CXR c/f PNA and pt has had cough, likely contributing to his generalized weakness. No hypoxia. CT imaging obtained 2/2 dementia, abdominal pain and both CT head and CT A/P unremarkable, though A/P does show possible cystitis. Given PNA, age, and weakness, will admit for further management. IV ABX given.  Final Clinical Impressions(s) / ED Diagnoses   Final diagnoses:  Community acquired pneumonia, unspecified laterality  Weakness generalized      Duffy Bruce, MD 02/16/16 2104

## 2016-02-15 NOTE — ED Triage Notes (Signed)
Pt comes from home via EMS.  Pt has dementia and lives at home wife and son.  Per family pt reportedly fell around 1545 this afternoon because they found him hovered over the back of their pick up truck stating he was weak and cannot walk.  States they found dirt on his leg so they assumed he fell.  Per pt he has no complaints.  Family also reports since fall pt has vomited twice and has been running a 100.0 fever. CBG 123 in route. 12-lead unremarkable.

## 2016-02-15 NOTE — ED Notes (Signed)
Patient transported to X-ray 

## 2016-02-15 NOTE — ED Notes (Signed)
Bed: WA08 Expected date:  Expected time:  Means of arrival:  Comments: 83 yo M weakness

## 2016-02-16 ENCOUNTER — Encounter (HOSPITAL_COMMUNITY): Payer: Self-pay | Admitting: Internal Medicine

## 2016-02-16 DIAGNOSIS — J189 Pneumonia, unspecified organism: Secondary | ICD-10-CM | POA: Diagnosis not present

## 2016-02-16 DIAGNOSIS — R531 Weakness: Secondary | ICD-10-CM | POA: Diagnosis not present

## 2016-02-16 DIAGNOSIS — F0150 Vascular dementia without behavioral disturbance: Secondary | ICD-10-CM

## 2016-02-16 LAB — BASIC METABOLIC PANEL
ANION GAP: 9 (ref 5–15)
BUN: 21 mg/dL — ABNORMAL HIGH (ref 6–20)
CHLORIDE: 104 mmol/L (ref 101–111)
CO2: 24 mmol/L (ref 22–32)
Calcium: 7.9 mg/dL — ABNORMAL LOW (ref 8.9–10.3)
Creatinine, Ser: 1.12 mg/dL (ref 0.61–1.24)
GFR calc Af Amer: >60 mL/min (ref >60)
GFR calc non Af Amer: 58 mL/min — ABNORMAL LOW (ref >60)
GLUCOSE: 120 mg/dL — AB (ref 65–99)
POTASSIUM: 3.8 mmol/L (ref 3.5–5.1)
Sodium: 137 mmol/L (ref 135–145)

## 2016-02-16 LAB — CBC
HEMATOCRIT: 34.1 % — AB (ref 39.0–52.0)
HEMOGLOBIN: 11.5 g/dL — AB (ref 13.0–17.0)
MCH: 29.9 pg (ref 26.0–34.0)
MCHC: 33.7 g/dL (ref 30.0–36.0)
MCV: 88.8 fL (ref 78.0–100.0)
Platelets: 137 10*3/uL — ABNORMAL LOW (ref 150–400)
RBC: 3.84 MIL/uL — ABNORMAL LOW (ref 4.22–5.81)
RDW: 12.9 % (ref 11.5–15.5)
WBC: 11.6 10*3/uL — ABNORMAL HIGH (ref 4.0–10.5)

## 2016-02-16 LAB — STREP PNEUMONIAE URINARY ANTIGEN: Strep Pneumo Urinary Antigen: NEGATIVE

## 2016-02-16 MED ORDER — ONDANSETRON HCL 4 MG PO TABS: 4.0000 mg | ORAL_TABLET | Freq: Four times a day (QID) | ORAL | Status: DC | PRN

## 2016-02-16 MED ORDER — AZITHROMYCIN 500 MG IV SOLR
500.0000 mg | INTRAVENOUS | Status: DC
  Administered 2016-02-16: 500 mg via INTRAVENOUS
  Filled 2016-02-16: qty 500

## 2016-02-16 MED ORDER — ACETAMINOPHEN 650 MG RE SUPP: 650.0000 mg | Freq: Four times a day (QID) | RECTAL | Status: DC | PRN

## 2016-02-16 MED ORDER — TAMSULOSIN HCL 0.4 MG PO CAPS
0.4000 mg | ORAL_CAPSULE | Freq: Every day | ORAL | Status: DC
  Administered 2016-02-17: 0.4 mg via ORAL
  Filled 2016-02-16 (×2): qty 1

## 2016-02-16 MED ORDER — ONDANSETRON HCL 4 MG/2ML IJ SOLN: 4.0000 mg | Freq: Four times a day (QID) | INTRAMUSCULAR | Status: DC | PRN

## 2016-02-16 MED ORDER — CHLORHEXIDINE GLUCONATE 0.12 % MT SOLN
15.0000 mL | Freq: Two times a day (BID) | OROMUCOSAL | Status: DC
  Administered 2016-02-16 (×2): 15 mL via OROMUCOSAL
  Filled 2016-02-16 (×2): qty 15

## 2016-02-16 MED ORDER — CEFTRIAXONE SODIUM 1 G IJ SOLR
1.0000 g | INTRAMUSCULAR | Status: DC
  Administered 2016-02-16: 1 g via INTRAVENOUS
  Filled 2016-02-16: qty 10

## 2016-02-16 MED ORDER — ACETAMINOPHEN 325 MG PO TABS: 650.0000 mg | ORAL_TABLET | Freq: Four times a day (QID) | ORAL | Status: DC | PRN

## 2016-02-16 MED ORDER — ENOXAPARIN SODIUM 40 MG/0.4ML ~~LOC~~ SOLN
40.0000 mg | SUBCUTANEOUS | Status: DC
  Administered 2016-02-16: 40 mg via SUBCUTANEOUS
  Filled 2016-02-16: qty 0.4

## 2016-02-16 MED ORDER — ORAL CARE MOUTH RINSE
15.0000 mL | Freq: Two times a day (BID) | OROMUCOSAL | Status: DC
  Administered 2016-02-16 (×2): 15 mL via OROMUCOSAL

## 2016-02-16 MED ORDER — SODIUM CHLORIDE 0.9 % IV SOLN
INTRAVENOUS | Status: AC
  Administered 2016-02-16 (×2): via INTRAVENOUS

## 2016-02-16 MED ORDER — DONEPEZIL HCL 5 MG PO TABS
5.0000 mg | ORAL_TABLET | Freq: Every day | ORAL | Status: DC
  Administered 2016-02-16: 5 mg via ORAL
  Filled 2016-02-16: qty 1

## 2016-02-16 MED ORDER — QUETIAPINE FUMARATE 25 MG PO TABS
25.0000 mg | ORAL_TABLET | Freq: Two times a day (BID) | ORAL | Status: DC
  Administered 2016-02-16 – 2016-02-17 (×3): 25 mg via ORAL
  Filled 2016-02-16 (×3): qty 1

## 2016-02-16 NOTE — Progress Notes (Signed)
PT Cancellation Note / Screen  Patient Details Name: Brett Santiago MRN: [id number] DOB: Jan 09, 1930   Cancelled Treatment:    Reason Eval/Treat Not Completed: PT screened, no needs identified, will sign off Spouse reports pt has been ambulating in hallway, NT confirms.  Spouse feels pt is almost back to baseline.  NT ambulating pt in hallways currently.  Pt does well with supervision.  No further PT needs identified at this time.  PT to sign off.   Onyx Schirmer,KATHrine E 02/16/2016, 11:03 AM Carmelia Bake, PT, DPT 02/16/2016 Pager: [phone]

## 2016-02-16 NOTE — Progress Notes (Addendum)
Pleasantly demented elderly male ambulating in hall way with assistance, denies sob after ambulating in short distance,  He does has intermittent agitation.  Fever trending down, [atient does not have productive cough, sputum not collected,  Flu negative, blood culture pending  continue abx for pneumonia, will get swallow eval to r/o aspiration as a cause of pneumonia. Anticipate discharge home in am.  CT abdomen done in the ER: "enlarged prostate gland,Diffuse urinary bladder wall thickening with mild perivesicular inflammation. Rounded masslike density involving the right anterior bladder with associated calcifications. Density in the right urinarytrigone be a bladder stone or debris." ua no infection, but does has rbc TNTC. Monitor hgb,  cystoscopy as outpatient.   H/o History of colon cancer status post right laparoscopic colectomy August 2008 for a T2 N0 moderately differentiated adenocarcinoma of the colon.  H/o Right lung nodule, hypermetabolic right hilar lymph  node, s/pRight video-assisted  thoracoscopic surgery for wedge resection right lung nodule and open  thoracoscopic biopsy of right hilar lymph node in 02/2007  pulmonary hamartoma, no evidence of malignancy.  Wife updated at bedside, she is aware of possible discharge on 2/2.

## 2016-02-16 NOTE — Progress Notes (Signed)
Spoke with pt's wife at bedside concerning [facility]. Mrs. Gorum stated,"My son helps me and we do not need HH at present time."

## 2016-02-16 NOTE — H&P (Signed)
History and Physical    Brett Santiago [id number] DOB: Aug 26, 1929 DOA: 02/15/2016  PCP: Roger Shelter, MD (Inactive)  Patient coming from: Home.  Chief Complaint: Weakness.  HPI: Brett Santiago is a 83 y.o. male with history of dementia, colon cancer was brought to the ER because of weakness. As per patient's son and wife who provided the history to the ER physician patient has been feeling weak since yesterday. Patient was finding it difficult to ambulate and nearly fell. In the ER x-rays show pneumonia with parapneumonic effusion. Patient is being admitted for further management of weakness and pneumonia. Patient on my exam is having productive cough. Denies any chest pain. Patient has advanced dementia and is a poor historian. No family at the bedside at this time.   ED Course: Patient was started on ceftriaxone and Zithromax for pneumonia. Influenza PCR was negative.  Review of Systems: As per HPI, rest all negative.   Past Medical History:  Diagnosis Date  . Colon cancer ([place])   . Dementia   . Memory loss   . Osteoporosis     Past Surgical History:  Procedure Laterality Date  . APPENDECTOMY    . BACK SURGERY  1988  . COLONOSCOPY    . LUNG SURGERY    . POLYPECTOMY    . TONSILLECTOMY     as child     reports that he has quit smoking. He has never used smokeless tobacco. He reports that he does not drink alcohol or use drugs.  Allergies  Allergen Reactions  . Advil [Ibuprofen] Other (See Comments)    Broke out in a sweat per wife    Family History  Problem Relation Age of Onset  . Colon cancer Neg Hx     Prior to Admission medications   Medication Sig Start Date End Date Taking? Authorizing Provider  Cholecalciferol (VITAMIN D PO) Take 1 tablet by mouth daily.   Yes Historical Provider, MD  donepezil (ARICEPT) 5 MG tablet Take 1 tablet by mouth Daily. 09/01/11  Yes Historical Provider, MD  Multiple Vitamins-Minerals (MULTIVITAMIN PO) Take 1 tablet by  mouth daily.   Yes Historical Provider, MD    Physical Exam: Vitals:   02/15/16 1856 02/15/16 2120 02/15/16 2322 02/16/16 0031  BP: 169/80 125/63 131/67 (!) 142/52  Pulse: 100 95 92 92  Resp: 18 19 18 18   Temp: 100.6 F (38.1 C)   98.2 F (36.8 C)  TempSrc: Oral   Oral  SpO2: 96% 95% 93% 96%  Weight:    73.3 kg (161 lb 9.6 oz)  Height:    6' (1.829 m)      Constitutional: Moderately built and nourished. Vitals:   02/15/16 1856 02/15/16 2120 02/15/16 2322 02/16/16 0031  BP: 169/80 125/63 131/67 (!) 142/52  Pulse: 100 95 92 92  Resp: 18 19 18 18   Temp: 100.6 F (38.1 C)   98.2 F (36.8 C)  TempSrc: Oral   Oral  SpO2: 96% 95% 93% 96%  Weight:    73.3 kg (161 lb 9.6 oz)  Height:    6' (1.829 m)   Eyes: Anicteric no pallor. ENMT: No discharge from the ears eyes nose or mouth. Neck: No mass felt. No JVD appreciated. Respiratory: No rhonchi or crepitations. Cardiovascular: S1-S2 heard no murmurs appreciated. Abdomen: Soft nontender bowel sounds present. No guarding or rigidity. Musculoskeletal: No edema. No joint effusion. Skin: No rash. Skin appears warm. Neurologic: Alert awake oriented to time place and person. Moves all  extremities. Psychiatric: Appears normal. Normal affect.   Labs on Admission: I have personally reviewed following labs and imaging studies  CBC:  Recent Labs Lab 02/15/16 1950  WBC 11.1*  HGB 14.5  HCT 42.5  MCV 88.5  PLT 123XX123   Basic Metabolic Panel:  Recent Labs Lab 02/15/16 1950  NA 140  K 3.6  CL 101  CO2 29  GLUCOSE 106*  BUN 22*  CREATININE 1.07  CALCIUM 8.9   GFR: Estimated Creatinine Clearance: 53.3 mL/min (by C-G formula based on SCr of 1.07 mg/dL). Liver Function Tests: No results for input(s): AST, ALT, ALKPHOS, BILITOT, PROT, ALBUMIN in the last 168 hours. No results for input(s): LIPASE, AMYLASE in the last 168 hours. No results for input(s): AMMONIA in the last 168 hours. Coagulation Profile: No results for  input(s): INR, PROTIME in the last 168 hours. Cardiac Enzymes: No results for input(s): CKTOTAL, CKMB, CKMBINDEX, TROPONINI in the last 168 hours. BNP (last 3 results) No results for input(s): PROBNP in the last 8760 hours. HbA1C: No results for input(s): HGBA1C in the last 72 hours. CBG: No results for input(s): GLUCAP in the last 168 hours. Lipid Profile: No results for input(s): CHOL, HDL, LDLCALC, TRIG, CHOLHDL, LDLDIRECT in the last 72 hours. Thyroid Function Tests: No results for input(s): TSH, T4TOTAL, FREET4, T3FREE, THYROIDAB in the last 72 hours. Anemia Panel: No results for input(s): VITAMINB12, FOLATE, FERRITIN, TIBC, IRON, RETICCTPCT in the last 72 hours. Urine analysis:    Component Value Date/Time   COLORURINE YELLOW 02/15/2016 2104   APPEARANCEUR HAZY (A) 02/15/2016 2104   LABSPEC 1.014 02/15/2016 2104   PHURINE 7.0 02/15/2016 2104   GLUCOSEU NEGATIVE 02/15/2016 2104   HGBUR MODERATE (A) 02/15/2016 2104   BILIRUBINUR NEGATIVE 02/15/2016 2104   KETONESUR NEGATIVE 02/15/2016 2104   PROTEINUR NEGATIVE 02/15/2016 2104   UROBILINOGEN 0.2 02/14/2007 1101   NITRITE NEGATIVE 02/15/2016 2104   LEUKOCYTESUR NEGATIVE 02/15/2016 2104   Sepsis Labs: @LABRCNTIP (procalcitonin:4,lacticidven:4) )No results found for this or any previous visit (from the past 240 hour(s)).   Radiological Exams on Admission: Dg Chest 2 View  Result Date: 02/15/2016 CLINICAL DATA:  Initial evaluation for acute weakness. EXAM: CHEST  2 VIEW COMPARISON:  Prior radiograph from 11/16/2014. FINDINGS: Mild cardiomegaly, stable. Mediastinal silhouette within normal limits. Lungs normally inflated. Surgical clips overlie the right perihilar region. There is patchy infiltrate within the peripheral right lower lobe, concerning for possible pneumonia. Superimposed scarring noted within this region. Mild patchy opacity within the retrocardiac left lower lobe may reflect atelectasis or additional infiltrate. No  pulmonary edema. Possible trace right pleural effusion noted. No pneumothorax. No acute osseous abnormality. IMPRESSION: 1. Patchy infiltrate within the peripheral right lung base, concerning for possible pneumonia. 2. Possible trace right parapneumonic effusion. Electronically Signed   By: Jeannine Boga M.D.   On: 02/15/2016 20:35   Ct Head Wo Contrast  Result Date: 02/15/2016 CLINICAL DATA:  Dementia. Fell this afternoon. Weakness. Unable to walk. EXAM: CT HEAD WITHOUT CONTRAST TECHNIQUE: Contiguous axial images were obtained from the base of the skull through the vertex without intravenous contrast. COMPARISON:  03/06/2005 FINDINGS: Brain: Generalized atrophy. Chronic small-vessel ischemic changes of the cerebral hemispheric white matter. No sign of acute infarction, mass lesion, hemorrhage, hydrocephalus or extra-axial collection. Vascular: There is atherosclerotic calcification of the major vessels at the base of the brain. Skull: No skull fracture or focal finding. Sinuses/Orbits: Clear/normal Other: None significant IMPRESSION: No acute or traumatic finding. Atrophy and chronic small-vessel ischemic change  of the white matter. Electronically Signed   By: Nelson Chimes M.D.   On: 02/15/2016 20:16   Ct Abdomen Pelvis W Contrast  Result Date: 02/15/2016 CLINICAL DATA:  Abdominal pain and vomiting.  Possible fall. EXAM: CT ABDOMEN AND PELVIS WITH CONTRAST TECHNIQUE: Multidetector CT imaging of the abdomen and pelvis was performed using the standard protocol following bolus administration of intravenous contrast. CONTRAST:  171mL ISOVUE-300 IOPAMIDOL (ISOVUE-300) INJECTION 61% COMPARISON:  Chest radiograph earlier this day. CT chest abdomen pelvis 01/01/2007 FINDINGS: Lower chest: Ground-glass opacity in the dependent right lobe has a slightly more confluent component peripherally. Postsurgical change of probable suture material. Minimal dependent ground-glass opacity in the left lower lobe. There is  bronchial thickening in the right greater than left lower lobe. Heart is normal in size. No pleural fluid. Hepatobiliary: Scattered small subcentimeter hepatic hypodensities, similar to prior exam. Small calcified gallstone without abnormal gallbladder distention or inflammation. No biliary dilatation. Pancreas: No ductal dilatation or inflammation. Spleen: Normal in size without focal abnormality. Adrenals/Urinary Tract: No adrenal nodule. No hydronephrosis or perinephric edema. Subcentimeter cyst in the upper left kidney. Urinary bladder is nondistended but thick walled. There is a rounded 18 mm masslike density involving the right anterior bladder with associated calcifications. Small dependent calcification in the right urinary trigone. Fluid density structure adjacent to the right urinary bladder may be a bladder diverticulum, increased in size from prior exam. Mild perivesicular stranding. Stomach/Bowel: Stomach is within normal limits. Appendix is not visualized, surgically absent per report. Descending and sigmoid colon are nondistended, difficult to exclude wall thickening. There is otherwise no bowel wall thickening, inflammation or distention. Vascular/Lymphatic: Aortic and branch atherosclerosis without aneurysm. Calcified and noncalcified atheromatous plaque. No adenopathy. Reproductive: Enlarged prostate gland spanning 6.1 cm. Other: No free air, free fluid, or intra-abdominal fluid collection. Musculoskeletal: Degenerative change in the lumbar spine without evidence of acute fracture. Motion artifact through the iliac crests, no evidence of pelvic fracture. There are no acute or suspicious osseous abnormalities. IMPRESSION: 1. Diffuse urinary bladder wall thickening with mild perivesicular inflammation. Rounded masslike density involving the right anterior bladder with associated calcifications. Recommend correlation with urinalysis to evaluate for urinary tract infection. Cystoscopy recommended to  evaluate bladder lesion. Density in the right urinary trigone be a bladder stone or debris. 2. Decompressed descending and sigmoid colon, difficult to exclude mild wall thickening. 3. Ground-glass opacities in both lower lobes, right greater than left, slightly more confluent in periphery of the right lower lobe. This may reflect pneumonia particularly on the right, however given distribution, aspiration is considered. 4. Chronic incidental findings of atherosclerosis, enlarged prostate gland, and small calcified gallstone. Electronically Signed   By: Jeb Levering M.D.   On: 02/15/2016 22:31    EKG: Independently reviewed. Sinus tachycardia.  Assessment/Plan Principal Problem:   CAP (community acquired pneumonia) Active Problems:   Weakness generalized    1. Community-acquired pneumonia - patient is placed on ceftriaxone and Zithromax. Follow urine for Legionella and strep antigen. Influenza PCR is negative. Follow cultures. 2. Generalized weakness probably from pneumonia - get physical therapy consult. Gently hydrate. 3. History of dementia on Aricept.  Patient had CT abdomen done in the ER which shows urinary bladder wall thickening which will need cystoscopy as outpatient.   DVT prophylaxis: Lovenox. Code Status: Full code.  Family Communication: No family at the bedside.  Disposition Plan: Home.  Consults called: Physical therapy.  Admission status: Observation.    Rise Patience MD [facility] Pager [phone].  If 7PM-7AM, please contact night-coverage [email or web address] Password [email or web address]  02/16/2016, 2:53 AM

## 2016-02-17 DIAGNOSIS — L8990 Pressure ulcer of unspecified site, unspecified stage: Secondary | ICD-10-CM | POA: Insufficient documentation

## 2016-02-17 DIAGNOSIS — L89151 Pressure ulcer of sacral region, stage 1: Secondary | ICD-10-CM

## 2016-02-17 DIAGNOSIS — R531 Weakness: Secondary | ICD-10-CM | POA: Diagnosis not present

## 2016-02-17 DIAGNOSIS — J189 Pneumonia, unspecified organism: Secondary | ICD-10-CM | POA: Diagnosis not present

## 2016-02-17 DIAGNOSIS — F0150 Vascular dementia without behavioral disturbance: Secondary | ICD-10-CM | POA: Diagnosis not present

## 2016-02-17 LAB — BASIC METABOLIC PANEL
Anion gap: 6 (ref 5–15)
BUN: 24 mg/dL — AB (ref 6–20)
CHLORIDE: 107 mmol/L (ref 101–111)
CO2: 27 mmol/L (ref 22–32)
CREATININE: 1.16 mg/dL (ref 0.61–1.24)
Calcium: 7.9 mg/dL — ABNORMAL LOW (ref 8.9–10.3)
GFR calc Af Amer: >60 mL/min (ref >60)
GFR, EST NON AFRICAN AMERICAN: 56 mL/min — AB (ref >60)
GLUCOSE: 95 mg/dL (ref 65–99)
POTASSIUM: 3.6 mmol/L (ref 3.5–5.1)
SODIUM: 140 mmol/L (ref 135–145)

## 2016-02-17 LAB — CBC
HCT: 30.4 % — ABNORMAL LOW (ref 39.0–52.0)
Hemoglobin: 10.2 g/dL — ABNORMAL LOW (ref 13.0–17.0)
MCH: 30.3 pg (ref 26.0–34.0)
MCHC: 33.6 g/dL (ref 30.0–36.0)
MCV: 90.2 fL (ref 78.0–100.0)
PLATELETS: 114 10*3/uL — AB (ref 150–400)
RBC: 3.37 MIL/uL — AB (ref 4.22–5.81)
RDW: 13.3 % (ref 11.5–15.5)
WBC: 7.7 10*3/uL (ref 4.0–10.5)

## 2016-02-17 LAB — LEGIONELLA PNEUMOPHILA SEROGP 1 UR AG: L. PNEUMOPHILA SEROGP 1 UR AG: NEGATIVE

## 2016-02-17 LAB — URINE CULTURE: CULTURE: NO GROWTH

## 2016-02-17 LAB — TSH: TSH: 2.357 u[IU]/mL (ref 0.350–4.500)

## 2016-02-17 LAB — MAGNESIUM: MAGNESIUM: 1.9 mg/dL (ref 1.7–2.4)

## 2016-02-17 LAB — PSA: PSA: 8.89 ng/mL — ABNORMAL HIGH (ref 0.00–4.00)

## 2016-02-17 MED ORDER — GUAIFENESIN ER 600 MG PO TB12: 600.0000 mg | ORAL_TABLET | Freq: Two times a day (BID) | ORAL | 0 refills | Status: DC

## 2016-02-17 MED ORDER — TAMSULOSIN HCL 0.4 MG PO CAPS: 0.4000 mg | ORAL_CAPSULE | Freq: Every day | ORAL | 0 refills | Status: DC

## 2016-02-17 MED ORDER — AZITHROMYCIN 250 MG PO TABS: 250.0000 mg | ORAL_TABLET | Freq: Every day | ORAL | 0 refills | Status: AC

## 2016-02-17 MED ORDER — AZITHROMYCIN 250 MG PO TABS: 250.0000 mg | ORAL_TABLET | ORAL | Status: DC

## 2016-02-17 NOTE — Evaluation (Signed)
Clinical/Bedside Swallow Evaluation Patient Details  Name: Brett Santiago MRN: [id number] Date of Birth: [birth year]  Today's Date: 02/17/2016 Time: SLP Start Time (ACUTE ONLY): [id number] SLP Stop Time (ACUTE ONLY): 0906 SLP Time Calculation (min) (ACUTE ONLY): 19 min  Past Medical History:  Past Medical History:  Diagnosis Date  . Colon cancer ([place])   . Dementia   . Memory loss   . Osteoporosis    Past Surgical History:  Past Surgical History:  Procedure Laterality Date  . APPENDECTOMY    . BACK SURGERY  1988  . COLONOSCOPY    . LUNG SURGERY    . POLYPECTOMY    . TONSILLECTOMY     as child   HPI:  Brett Santiago a 83 y.o.malewith history of dementia, colon cancer was brought to the ER because of weakness. As per patient's son and wife who provided the history to the ER physician patient has been feeling weak since yesterday. Patient was finding it difficult to ambulate and nearly fell. In the ER x-rays showspatchy infiltrate RLL concerning for PNA with parapneumonic effusion. Patient is being admitted for further management of weakness and pneumonia. Patient on my exam is having productive cough. Denies any chest pain. Patient has advanced dementia and is a poor historian. No family at the bedside at this time.  No previous swallow work up noted at [facility].     Assessment / Plan / Recommendation Clinical Impression  Clinical swallowing evaluation was completed using thin liquids, pureed material and dry solids.  Oral mechanism exam was completed and remarkable only for mild lingual deviation to the left during movement.  The patient's oral and pharyngeal swallow appeared to be essentially functional.  Patient appeared to have very mildly delayed swallow trigger most noted given thin liquids otherwise swallow trigger appeared to be timely.  Mastication of dry solids was functional.  Overt s/s of aspiration were not seen.  The [facility] Protocol was completed and the patient was not  oriented and pleasantly confused.  He required cues to follow all commands.   He was able to continuously drink 3 ozs of water without obvious s/s of aspiration.  The patient was noted to belch throughout assessment suggesting possible esophageal issues.   Recommend patient continue on a regular diet with thin liquids.  ST will follow up for therapeutic diet tolerance given presence of RLL PNA.     Aspiration Risk  Mild aspiration risk    Diet Recommendation   Regular with thin liquids  Medication Administration: Whole meds with liquid    Other  Recommendations Oral Care Recommendations: Oral care BID   Follow up Recommendations  (TBD)      Frequency and Duration min 2x/week  2 weeks       Prognosis Prognosis for Safe Diet Advancement: Good      Swallow Study   General Date of Onset: 02/15/16 HPI: Brett Santiago a 83 y.o.malewith history of dementia, colon cancer was brought to the ER because of weakness. As per patient's son and wife who provided the history to the ER physician patient has been feeling weak since yesterday. Patient was finding it difficult to ambulate and nearly fell. In the ER x-rays showspatchy infiltrate RLL concerning for PNA with parapneumonic effusion. Patient is being admitted for further management of weakness and pneumonia. Patient on my exam is having productive cough. Denies any chest pain. Patient has advanced dementia and is a poor historian. No family at the bedside at this time.  No previous swallow work up noted at [facility].   Type of Study: Bedside Swallow Evaluation Diet Prior to this Study: Regular;Thin liquids Temperature Spikes Noted: Yes History of Recent Intubation: No Behavior/Cognition: Alert;Cooperative;Confused;Requires cueing Oral Cavity Assessment: Within Functional Limits Oral Care Completed by SLP: No Oral Cavity - Dentition: Dentures, top;Dentures, bottom (Bottom dentures are very poorly fitting.  ) Vision: Functional for  self-feeding Self-Feeding Abilities: Able to feed self Patient Positioning: Upright in bed Baseline Vocal Quality: Normal Volitional Cough: Strong Volitional Swallow: Able to elicit    Oral/Motor/Sensory Function Overall Oral Motor/Sensory Function: Mild impairment Facial Symmetry: Within Functional Limits Facial Strength: Within Functional Limits Lingual ROM: Reduced left (slight deviation to the left with movement.  ) Lingual Symmetry: Abnormal symmetry left Lingual Strength: Within Functional Limits Mandible: Within Functional Limits   Ice Chips Ice chips: Not tested   Thin Liquid Thin Liquid: Impaired Presentation: Cup;Spoon;Straw Pharyngeal  Phase Impairments: Suspected delayed Swallow    Nectar Thick Nectar Thick Liquid: Not tested   Honey Thick Honey Thick Liquid: Not tested   Puree Puree: Within functional limits Presentation: Spoon   Solid   GO   Solid: Within functional limits Presentation: Self Fed    Functional Assessment Tool Used: ASHA NOMS and clinical judgment.   Functional Limitations: Swallowing Swallow Current Status [phone]): At least 1 percent but less than 20 percent impaired, limited or restricted Swallow Goal Status [phone]): At least 1 percent but less than 20 percent impaired, limited or restricted   Shelly Flatten, [place], [facility] SLP [phone] Shelly Flatten N 02/17/2016,9:17 AM

## 2016-02-17 NOTE — Care Management Obs Status (Signed)
MEDICARE OBSERVATION STATUS NOTIFICATION   Patient Details  Name: Brett Santiago MRN: [id number] Date of Birth: [birth year]   Medicare Observation Status Notification Given:  Yes, left in room for family pt is confused. Wife is aware that pt is observation status.     Purcell Mouton, RN 02/17/2016, 11:33 AM

## 2016-02-17 NOTE — Discharge Summary (Signed)
Discharge Summary  Brett Santiago [id number] DOB: 16-Jan-1930  PCP: Roger Shelter, MD (Inactive)  Admit date: 02/15/2016 Discharge date: 02/17/2016  Time spent: <90mins  Recommendations for Outpatient Follow-up:  1. F/u with PMD within a week  for hospital discharge follow up, repeat cbc/bmp at follow up. repeat cxr in 3- 4weeks to ensure resolution of pneumonia. pmd to follow up on final culture result. 2. F/u with urology , consider cystoscope for blood in the urine and prostate evaluation.  Discharge Diagnoses:  Active Hospital Problems   Diagnosis Date Noted  . CAP (community acquired pneumonia) 02/15/2016  . Pressure injury of skin 02/17/2016  . Weakness generalized 02/16/2016  . Vascular dementia without behavioral disturbance     Resolved Hospital Problems   Diagnosis Date Noted Date Resolved  No resolved problems to display.    Discharge Condition: stable  Diet recommendation: regular diet  Filed Weights   02/16/16 0031  Weight: 73.3 kg (161 lb 9.6 oz)    History of present illness:  Patient coming from: Home.  Chief Complaint: Weakness.  HPI: Brett Santiago is a 83 y.o. male with history of dementia, colon cancer was brought to the ER because of weakness. As per patient's son and wife who provided the history to the ER physician patient has been feeling weak since yesterday. Patient was finding it difficult to ambulate and nearly fell. In the ER x-rays show pneumonia with parapneumonic effusion. Patient is being admitted for further management of weakness and pneumonia. Patient on my exam is having productive cough. Denies any chest pain. Patient has advanced dementia and is a poor historian. No family at the bedside at this time.   ED Course: Patient was started on ceftriaxone and Zithromax for pneumonia. Influenza PCR was negative.  Hospital Course:  Principal Problem:   CAP (community acquired pneumonia) Active Problems:   Weakness generalized  Vascular dementia without behavioral disturbance   Pressure injury of skin   1. Community-acquired pneumonia with fever, leukocytosis on admission - patient is treated with ceftriaxone and Zithromax. Fever and leukocytosis resolved, he does not have productive cough, urine strep antigen negative. Influenza PCR is negative. He passed swallow eval with regular diet/thin liquid. He is discharged on zithromax. 2. Generalized weakness probably from pneumonia - he improved, back to baseline, wife declined home health arrangement. 3. History of dementia on Aricept. Not oriented, pleasantly demented. 4.  CT abdomen done in the ER: "enlarged prostate gland,Diffuse urinary bladder wall thickening with mild perivesicular inflammation. Rounded masslike density involving the right anterior bladder with associated calcifications. Density in the right urinarytrigone be a bladder stone or debris." ua no infection, but does has rbc TNTC. psa elevated, urology follow up for possible cystoscopy and prostate eval  as outpatient. He is discharged on flomax.   H/o History of colon cancer status post right laparoscopic colectomy August 2008 for a T2 N0 moderately differentiated adenocarcinoma of the colon.  H/o Right lung nodule, hypermetabolic right hilar lymph node, s/pRight video-assisted thoracoscopic surgery for wedge resection right lung nodule and open thoracoscopic biopsy of right hilar lymph node in 02/2007.  pulmonary hamartoma, no evidence of malignancy.    Code Status: Full code.  Family Communication: wife  Disposition Plan: Home.  Consults called: none  Procedures:  none  Consultations:  none  Discharge Exam: BP (!) 142/66 (BP Location: Right Arm)   Pulse 72   Temp 98.3 F (36.8 C) (Oral)   Resp 20   Ht 6' (1.829 m)  Wt 73.3 kg (161 lb 9.6 oz)   SpO2 96%   BMI 21.92 kg/m   General: only oriented to self, pleasantly demented Cardiovascular: RRR Respiratory: mild  crackles at basis, no wheezing, no rhonchi  Discharge Instructions You were cared for by a hospitalist during your hospital stay. If you have any questions about your discharge medications or the care you received while you were in the hospital after you are discharged, you can call the unit and asked to speak with the hospitalist on call if the hospitalist that took care of you is not available. Once you are discharged, your primary care physician will handle any further medical issues. Please note that NO REFILLS for any discharge medications will be authorized once you are discharged, as it is imperative that you return to your primary care physician (or establish a relationship with a primary care physician if you do not have one) for your aftercare needs so that they can reassess your need for medications and monitor your lab values.  Discharge Instructions    Diet general    Complete by:  As directed    Face-to-face encounter (required for Medicare/Medicaid patients)    Complete by:  As directed    I Tekla Malachowski certify that this patient is under my care and that I, or a nurse practitioner or physician's assistant working with me, had a face-to-face encounter that meets the physician face-to-face encounter requirements with this patient on 02/16/2016. The encounter with the patient was in whole, or in part for the following medical condition(s) which is the primary reason for home health care (List medical condition): FTT   The encounter with the patient was in whole, or in part, for the following medical condition, which is the primary reason for home health care:  FTT   I certify that, based on my findings, the following services are medically necessary home health services:  Nursing   Reason for Medically Necessary Home Health Services:  Skilled Nursing- Change/Decline in Patient Status   My clinical findings support the need for the above services:  Cognitive impairments, dementia, or mental  confusion  that make it unsafe to leave home   Further, I certify that my clinical findings support that this patient is homebound due to:  Mental confusion   Home Health    Complete by:  As directed    To provide the following care/treatments:   RN Social work     Increase activity slowly    Complete by:  As directed      Allergies as of 02/17/2016      Reactions   Advil [ibuprofen] Other (See Comments)   Broke out in a sweat per wife      Medication List    TAKE these medications   azithromycin 250 MG tablet Commonly known as:  ZITHROMAX Take 1 tablet (250 mg total) by mouth daily.   donepezil 5 MG tablet Commonly known as:  ARICEPT Take 1 tablet by mouth Daily.   guaiFENesin 600 MG 12 hr tablet Commonly known as:  MUCINEX Take 1 tablet (600 mg total) by mouth 2 (two) times daily.   MULTIVITAMIN PO Take 1 tablet by mouth daily.   tamsulosin 0.4 MG Caps capsule Commonly known as:  FLOMAX Take 1 capsule (0.4 mg total) by mouth daily.   VITAMIN D PO Take 1 tablet by mouth daily.      Allergies  Allergen Reactions  . Advil [Ibuprofen] Other (See Comments)    Broke  out in a sweat per wife   [facility] Follow up in 2 week(s).   Why:  for blood in urine and bladder wall thickening , elevated psa,  consider cystoscope for blood in the urine and prostate evaluation. Contact information: [place] [address] [phone]        Roger Shelter, MD Follow up in 1 week(s).   Specialty:  Anesthesiology Why:  hospital discharge follow up, repeat cbc/bmp at follow up. repeat cxr in 3- 4weeks to ensure resolution of pneumonia. Contact information: [place] [address] [phone]            The results of significant diagnostics from this hospitalization (including imaging, microbiology, ancillary and laboratory) are listed below for reference.    Significant  Diagnostic Studies: Dg Chest 2 View  Result Date: 02/15/2016 CLINICAL DATA:  Initial evaluation for acute weakness. EXAM: CHEST  2 VIEW COMPARISON:  Prior radiograph from 11/16/2014. FINDINGS: Mild cardiomegaly, stable. Mediastinal silhouette within normal limits. Lungs normally inflated. Surgical clips overlie the right perihilar region. There is patchy infiltrate within the peripheral right lower lobe, concerning for possible pneumonia. Superimposed scarring noted within this region. Mild patchy opacity within the retrocardiac left lower lobe may reflect atelectasis or additional infiltrate. No pulmonary edema. Possible trace right pleural effusion noted. No pneumothorax. No acute osseous abnormality. IMPRESSION: 1. Patchy infiltrate within the peripheral right lung base, concerning for possible pneumonia. 2. Possible trace right parapneumonic effusion. Electronically Signed   By: Jeannine Boga M.D.   On: 02/15/2016 20:35   Ct Head Wo Contrast  Result Date: 02/15/2016 CLINICAL DATA:  Dementia. Fell this afternoon. Weakness. Unable to walk. EXAM: CT HEAD WITHOUT CONTRAST TECHNIQUE: Contiguous axial images were obtained from the base of the skull through the vertex without intravenous contrast. COMPARISON:  03/06/2005 FINDINGS: Brain: Generalized atrophy. Chronic small-vessel ischemic changes of the cerebral hemispheric white matter. No sign of acute infarction, mass lesion, hemorrhage, hydrocephalus or extra-axial collection. Vascular: There is atherosclerotic calcification of the major vessels at the base of the brain. Skull: No skull fracture or focal finding. Sinuses/Orbits: Clear/normal Other: None significant IMPRESSION: No acute or traumatic finding. Atrophy and chronic small-vessel ischemic change of the white matter. Electronically Signed   By: Nelson Chimes M.D.   On: 02/15/2016 20:16   Ct Abdomen Pelvis W Contrast  Result Date: 02/15/2016 CLINICAL DATA:  Abdominal pain and vomiting.   Possible fall. EXAM: CT ABDOMEN AND PELVIS WITH CONTRAST TECHNIQUE: Multidetector CT imaging of the abdomen and pelvis was performed using the standard protocol following bolus administration of intravenous contrast. CONTRAST:  140mL ISOVUE-300 IOPAMIDOL (ISOVUE-300) INJECTION 61% COMPARISON:  Chest radiograph earlier this day. CT chest abdomen pelvis 01/01/2007 FINDINGS: Lower chest: Ground-glass opacity in the dependent right lobe has a slightly more confluent component peripherally. Postsurgical change of probable suture material. Minimal dependent ground-glass opacity in the left lower lobe. There is bronchial thickening in the right greater than left lower lobe. Heart is normal in size. No pleural fluid. Hepatobiliary: Scattered small subcentimeter hepatic hypodensities, similar to prior exam. Small calcified gallstone without abnormal gallbladder distention or inflammation. No biliary dilatation. Pancreas: No ductal dilatation or inflammation. Spleen: Normal in size without focal abnormality. Adrenals/Urinary Tract: No adrenal nodule. No hydronephrosis or perinephric edema. Subcentimeter cyst in the upper left kidney. Urinary bladder is nondistended but thick walled. There is a rounded 18 mm masslike density involving the right  anterior bladder with associated calcifications. Small dependent calcification in the right urinary trigone. Fluid density structure adjacent to the right urinary bladder may be a bladder diverticulum, increased in size from prior exam. Mild perivesicular stranding. Stomach/Bowel: Stomach is within normal limits. Appendix is not visualized, surgically absent per report. Descending and sigmoid colon are nondistended, difficult to exclude wall thickening. There is otherwise no bowel wall thickening, inflammation or distention. Vascular/Lymphatic: Aortic and branch atherosclerosis without aneurysm. Calcified and noncalcified atheromatous plaque. No adenopathy. Reproductive: Enlarged  prostate gland spanning 6.1 cm. Other: No free air, free fluid, or intra-abdominal fluid collection. Musculoskeletal: Degenerative change in the lumbar spine without evidence of acute fracture. Motion artifact through the iliac crests, no evidence of pelvic fracture. There are no acute or suspicious osseous abnormalities. IMPRESSION: 1. Diffuse urinary bladder wall thickening with mild perivesicular inflammation. Rounded masslike density involving the right anterior bladder with associated calcifications. Recommend correlation with urinalysis to evaluate for urinary tract infection. Cystoscopy recommended to evaluate bladder lesion. Density in the right urinary trigone be a bladder stone or debris. 2. Decompressed descending and sigmoid colon, difficult to exclude mild wall thickening. 3. Ground-glass opacities in both lower lobes, right greater than left, slightly more confluent in periphery of the right lower lobe. This may reflect pneumonia particularly on the right, however given distribution, aspiration is considered. 4. Chronic incidental findings of atherosclerosis, enlarged prostate gland, and small calcified gallstone. Electronically Signed   By: Jeb Levering M.D.   On: 02/15/2016 22:31    Microbiology: Recent Results (from the past 240 hour(s))  Urine culture     Status: None   Collection Time: 02/15/16  9:04 PM  Result Value Ref Range Status   Specimen Description URINE, CLEAN CATCH  Final   Special Requests NONE  Final   Culture   Final    NO GROWTH Performed at [facility], [address] [place], [place] [address]    Report Status 02/17/2016 FINAL  Final     Labs: Basic Metabolic Panel:  Recent Labs Lab 02/15/16 1950 02/16/16 0130 02/17/16 0510  NA 140 137 140  K 3.6 3.8 3.6  CL 101 104 107  CO2 29 24 27   GLUCOSE 106* 120* 95  BUN 22* 21* 24*  CREATININE 1.07 1.12 1.16  CALCIUM 8.9 7.9* 7.9*  MG  --   --  1.9   Liver Function Tests: No results for  input(s): AST, ALT, ALKPHOS, BILITOT, PROT, ALBUMIN in the last 168 hours. No results for input(s): LIPASE, AMYLASE in the last 168 hours. No results for input(s): AMMONIA in the last 168 hours. CBC:  Recent Labs Lab 02/15/16 1950 02/16/16 0130 02/17/16 0510  WBC 11.1* 11.6* 7.7  HGB 14.5 11.5* 10.2*  HCT 42.5 34.1* 30.4*  MCV 88.5 88.8 90.2  PLT 151 137* 114*   Cardiac Enzymes: No results for input(s): CKTOTAL, CKMB, CKMBINDEX, TROPONINI in the last 168 hours. BNP: BNP (last 3 results) No results for input(s): BNP in the last 8760 hours.  ProBNP (last 3 results) No results for input(s): PROBNP in the last 8760 hours.  CBG: No results for input(s): GLUCAP in the last 168 hours.     SignedFlorencia Reasons MD, PhD  [facility] 02/17/2016, 11:17 AM

## 2016-02-21 LAB — CULTURE, BLOOD (ROUTINE X 2)
CULTURE: NO GROWTH
Culture: NO GROWTH

## 2016-02-28 ENCOUNTER — Other Ambulatory Visit: Payer: Self-pay | Admitting: Urology

## 2016-02-29 ENCOUNTER — Other Ambulatory Visit: Payer: Self-pay | Admitting: Urology

## 2016-03-05 ENCOUNTER — Encounter (HOSPITAL_BASED_OUTPATIENT_CLINIC_OR_DEPARTMENT_OTHER): Payer: Self-pay | Admitting: *Deleted

## 2016-03-05 NOTE — Progress Notes (Addendum)
SPOKE W/ PT'S WIFE. PT HAS DEMENTIA.  NPO AFTER MN.  ARRIVE AT 1030.  WILL TAKE AM MEDS W/ SIPS OF WATER DOS.  ADVISED THAT PT SHOULD HAVE FOLLOW-UP W/ HIS PCP PRIOR TO SURGERY ON Friday AS PER DISCHARGE INSTRUCTIONS FROM HOSPITAL 02-17-2016 FOR RESOLUTION PNEUMONIA.  ADDENDUM:  PT WIFE CALLED VIA PHONE , STATED THAT PT HAS FOLLOW-UP APPT W/ HIS PCP AND HAD CHEST XRAY DONE.  MS Forstrom STATED THAT PCP TOLD HER CHEST XRAY WAS CLEAR , NO PNEUMONIA.  SHE STATED THAT PCP WAS TO FAX RESULTS TO [facility].

## 2016-03-08 MED ORDER — SODIUM CHLORIDE 0.9 % IV SOLN: 50.0000 mg | Freq: Once | INTRAVENOUS | Status: AC

## 2016-03-09 ENCOUNTER — Ambulatory Visit (HOSPITAL_BASED_OUTPATIENT_CLINIC_OR_DEPARTMENT_OTHER): Payer: Medicare Other | Admitting: Anesthesiology

## 2016-03-09 ENCOUNTER — Observation Stay (HOSPITAL_BASED_OUTPATIENT_CLINIC_OR_DEPARTMENT_OTHER)
Admission: RE | Admit: 2016-03-09 | Discharge: 2016-03-10 | Disposition: A | Discharge status: Home / Self Care | Payer: Medicare Other | Source: Ambulatory Visit | Attending: Urology | Admitting: Urology

## 2016-03-09 ENCOUNTER — Encounter (HOSPITAL_COMMUNITY)
Admission: RE | Disposition: A | Discharge status: Home / Self Care | Payer: Self-pay | Source: Ambulatory Visit | Attending: Urology

## 2016-03-09 ENCOUNTER — Encounter (HOSPITAL_BASED_OUTPATIENT_CLINIC_OR_DEPARTMENT_OTHER): Payer: Self-pay | Admitting: *Deleted

## 2016-03-09 DIAGNOSIS — Z87891 Personal history of nicotine dependence: Secondary | ICD-10-CM | POA: Diagnosis not present

## 2016-03-09 DIAGNOSIS — Z9889 Other specified postprocedural states: Secondary | ICD-10-CM | POA: Diagnosis not present

## 2016-03-09 DIAGNOSIS — Z85038 Personal history of other malignant neoplasm of large intestine: Secondary | ICD-10-CM | POA: Diagnosis not present

## 2016-03-09 DIAGNOSIS — M810 Age-related osteoporosis without current pathological fracture: Secondary | ICD-10-CM | POA: Diagnosis not present

## 2016-03-09 DIAGNOSIS — F0150 Vascular dementia without behavioral disturbance: Secondary | ICD-10-CM | POA: Insufficient documentation

## 2016-03-09 DIAGNOSIS — Z8701 Personal history of pneumonia (recurrent): Secondary | ICD-10-CM | POA: Insufficient documentation

## 2016-03-09 DIAGNOSIS — N400 Enlarged prostate without lower urinary tract symptoms: Secondary | ICD-10-CM | POA: Diagnosis not present

## 2016-03-09 DIAGNOSIS — C672 Malignant neoplasm of lateral wall of bladder: Secondary | ICD-10-CM | POA: Diagnosis not present

## 2016-03-09 DIAGNOSIS — C679 Malignant neoplasm of bladder, unspecified: Secondary | ICD-10-CM | POA: Diagnosis present

## 2016-03-09 DIAGNOSIS — Z9049 Acquired absence of other specified parts of digestive tract: Secondary | ICD-10-CM | POA: Diagnosis not present

## 2016-03-09 HISTORY — DX: Presence of spectacles and contact lenses: Z97.3

## 2016-03-09 HISTORY — DX: Malignant neoplasm of bladder, unspecified: C67.9

## 2016-03-09 HISTORY — DX: Anemia, unspecified: D64.9

## 2016-03-09 HISTORY — DX: Elevated prostate specific antigen (PSA): R97.20

## 2016-03-09 HISTORY — DX: Vascular dementia, unspecified severity, without behavioral disturbance, psychotic disturbance, mood disturbance, and anxiety: F01.50

## 2016-03-09 HISTORY — PX: TRANSURETHRAL RESECTION OF BLADDER TUMOR: SHX2575

## 2016-03-09 HISTORY — DX: Presence of dental prosthetic device (complete) (partial): K08.109

## 2016-03-09 HISTORY — DX: Presence of dental prosthetic device (complete) (partial): Z97.2

## 2016-03-09 HISTORY — DX: Benign prostatic hyperplasia without lower urinary tract symptoms: N40.0

## 2016-03-09 HISTORY — DX: Personal history of other malignant neoplasm of large intestine: Z85.038

## 2016-03-09 HISTORY — DX: Personal history of other specified conditions: Z87.898

## 2016-03-09 SURGERY — TURBT (TRANSURETHRAL RESECTION OF BLADDER TUMOR)
Anesthesia: General | Site: Bladder

## 2016-03-09 MED ORDER — STERILE WATER FOR IRRIGATION IR SOLN
Status: DC | PRN
  Administered 2016-03-09: 3000 mL

## 2016-03-09 MED ORDER — PHENYLEPHRINE 40 MCG/ML (10ML) SYRINGE FOR IV PUSH (FOR BLOOD PRESSURE SUPPORT)
PREFILLED_SYRINGE | INTRAVENOUS | Status: DC | PRN
  Administered 2016-03-09: 120 ug via INTRAVENOUS
  Administered 2016-03-09: 80 ug via INTRAVENOUS

## 2016-03-09 MED ORDER — EPHEDRINE SULFATE-NACL 50-0.9 MG/10ML-% IV SOSY
PREFILLED_SYRINGE | INTRAVENOUS | Status: DC | PRN
  Administered 2016-03-09: 10 mg via INTRAVENOUS
  Administered 2016-03-09: 5 mg via INTRAVENOUS

## 2016-03-09 MED ORDER — ONDANSETRON HCL 4 MG/2ML IJ SOLN
INTRAMUSCULAR | Status: AC
  Filled 2016-03-09: qty 2

## 2016-03-09 MED ORDER — SODIUM CHLORIDE 0.45 % IV SOLN
INTRAVENOUS | Status: DC
  Administered 2016-03-09 – 2016-03-10 (×2): via INTRAVENOUS

## 2016-03-09 MED ORDER — HYDROCODONE-ACETAMINOPHEN 5-325 MG PO TABS
1.0000 | ORAL_TABLET | ORAL | Status: DC | PRN
  Administered 2016-03-09: 1 via ORAL
  Filled 2016-03-09: qty 1

## 2016-03-09 MED ORDER — SENNA 8.6 MG PO TABS
1.0000 | ORAL_TABLET | Freq: Two times a day (BID) | ORAL | Status: DC
  Administered 2016-03-09 – 2016-03-10 (×2): 8.6 mg via ORAL
  Filled 2016-03-09 (×2): qty 1

## 2016-03-09 MED ORDER — OXYBUTYNIN CHLORIDE 5 MG PO TABS
5.0000 mg | ORAL_TABLET | Freq: Three times a day (TID) | ORAL | Status: DC | PRN
Start: 2016-03-09 — End: 2016-03-10

## 2016-03-09 MED ORDER — FENTANYL CITRATE (PF) 100 MCG/2ML IJ SOLN
INTRAMUSCULAR | Status: AC
  Filled 2016-03-09: qty 2

## 2016-03-09 MED ORDER — TAMSULOSIN HCL 0.4 MG PO CAPS
0.4000 mg | ORAL_CAPSULE | Freq: Every day | ORAL | Status: DC
  Administered 2016-03-10: 0.4 mg via ORAL
  Filled 2016-03-09: qty 1

## 2016-03-09 MED ORDER — FENTANYL CITRATE (PF) 100 MCG/2ML IJ SOLN
25.0000 ug | INTRAMUSCULAR | Status: DC | PRN
  Filled 2016-03-09: qty 1

## 2016-03-09 MED ORDER — LIDOCAINE 2% (20 MG/ML) 5 ML SYRINGE
INTRAMUSCULAR | Status: AC
  Filled 2016-03-09: qty 5

## 2016-03-09 MED ORDER — SULFAMETHOXAZOLE-TRIMETHOPRIM 800-160 MG PO TABS
1.0000 | ORAL_TABLET | Freq: Two times a day (BID) | ORAL | Status: DC
  Administered 2016-03-09 – 2016-03-10 (×2): 1 via ORAL
  Filled 2016-03-09 (×2): qty 1

## 2016-03-09 MED ORDER — CEFAZOLIN IN D5W 1 GM/50ML IV SOLN
1.0000 g | INTRAVENOUS | Status: DC
  Filled 2016-03-09: qty 50

## 2016-03-09 MED ORDER — SODIUM CHLORIDE 0.9 % IV SOLN
50.0000 mg | Freq: Once | INTRAVENOUS | Status: AC
  Administered 2016-03-09: 50 mg via INTRAVESICAL
  Filled 2016-03-09 (×2): qty 25

## 2016-03-09 MED ORDER — DONEPEZIL HCL 5 MG PO TABS
5.0000 mg | ORAL_TABLET | Freq: Every morning | ORAL | Status: DC
  Administered 2016-03-10: 5 mg via ORAL
  Filled 2016-03-09: qty 1

## 2016-03-09 MED ORDER — PROMETHAZINE HCL 25 MG/ML IJ SOLN
6.2500 mg | INTRAMUSCULAR | Status: DC | PRN
  Filled 2016-03-09: qty 1

## 2016-03-09 MED ORDER — ACETAMINOPHEN 10 MG/ML IV SOLN
INTRAVENOUS | Status: DC | PRN
  Administered 2016-03-09: 1000 mg via INTRAVENOUS

## 2016-03-09 MED ORDER — QUETIAPINE FUMARATE 25 MG PO TABS
25.0000 mg | ORAL_TABLET | Freq: Every day | ORAL | Status: DC
  Administered 2016-03-09: 25 mg via ORAL
  Filled 2016-03-09: qty 1

## 2016-03-09 MED ORDER — LACTATED RINGERS IV SOLN
INTRAVENOUS | Status: DC
  Administered 2016-03-09: 12:00:00 via INTRAVENOUS
  Filled 2016-03-09: qty 1000

## 2016-03-09 MED ORDER — PHENYLEPHRINE 40 MCG/ML (10ML) SYRINGE FOR IV PUSH (FOR BLOOD PRESSURE SUPPORT)
PREFILLED_SYRINGE | INTRAVENOUS | Status: AC
  Filled 2016-03-09: qty 10

## 2016-03-09 MED ORDER — ACETAMINOPHEN 325 MG PO TABS: 650.0000 mg | ORAL_TABLET | ORAL | Status: DC | PRN

## 2016-03-09 MED ORDER — CEFAZOLIN SODIUM-DEXTROSE 2-4 GM/100ML-% IV SOLN
INTRAVENOUS | Status: AC
  Filled 2016-03-09: qty 100

## 2016-03-09 MED ORDER — CEFAZOLIN SODIUM-DEXTROSE 2-4 GM/100ML-% IV SOLN
2.0000 g | INTRAVENOUS | Status: AC
  Administered 2016-03-09: 2 g via INTRAVENOUS
  Filled 2016-03-09: qty 100

## 2016-03-09 MED ORDER — LIDOCAINE 2% (20 MG/ML) 5 ML SYRINGE
INTRAMUSCULAR | Status: DC | PRN
  Administered 2016-03-09: 80 mg via INTRAVENOUS

## 2016-03-09 MED ORDER — ACETAMINOPHEN 10 MG/ML IV SOLN
INTRAVENOUS | Status: AC
  Filled 2016-03-09: qty 100

## 2016-03-09 MED ORDER — VITAMIN D3 25 MCG (1000 UNIT) PO TABS
1000.0000 [IU] | ORAL_TABLET | Freq: Every day | ORAL | Status: DC
  Administered 2016-03-10: 1000 [IU] via ORAL
  Filled 2016-03-09: qty 1

## 2016-03-09 MED ORDER — PROPOFOL 10 MG/ML IV BOLUS
INTRAVENOUS | Status: AC
  Filled 2016-03-09: qty 20

## 2016-03-09 MED ORDER — ONDANSETRON HCL 4 MG/2ML IJ SOLN
INTRAMUSCULAR | Status: DC | PRN
  Administered 2016-03-09: 4 mg via INTRAVENOUS

## 2016-03-09 MED ORDER — LORAZEPAM 0.5 MG PO TABS
0.5000 mg | ORAL_TABLET | Freq: Four times a day (QID) | ORAL | Status: DC | PRN
  Administered 2016-03-09: 0.5 mg via ORAL
  Filled 2016-03-09: qty 1

## 2016-03-09 MED ORDER — FENTANYL CITRATE (PF) 100 MCG/2ML IJ SOLN
INTRAMUSCULAR | Status: DC | PRN
  Administered 2016-03-09 (×2): 25 ug via INTRAVENOUS

## 2016-03-09 MED ORDER — EPHEDRINE 5 MG/ML INJ
INTRAVENOUS | Status: AC
  Filled 2016-03-09: qty 10

## 2016-03-09 MED ORDER — PROPOFOL 10 MG/ML IV BOLUS
INTRAVENOUS | Status: DC | PRN
  Administered 2016-03-09: 130 mg via INTRAVENOUS

## 2016-03-09 SURGICAL SUPPLY — 19 items
BAG DRAIN URO-CYSTO SKYTR STRL (DRAIN) ×3 IMPLANT
CATH FOLEY 2WAY SLVR  5CC 20FR (CATHETERS) ×2
CATH FOLEY 2WAY SLVR 5CC 20FR (CATHETERS) ×1 IMPLANT
CLOTH BEACON ORANGE TIMEOUT ST (SAFETY) ×3 IMPLANT
ELECT REM PT RETURN 9FT ADLT (ELECTROSURGICAL) ×3
ELECTRODE REM PT RTRN 9FT ADLT (ELECTROSURGICAL) ×1 IMPLANT
GLOVE BIO SURGEON STRL SZ8 (GLOVE) ×3 IMPLANT
GOWN STRL REUS W/TWL LRG LVL3 (GOWN DISPOSABLE) ×3 IMPLANT
GOWN STRL REUS W/TWL XL LVL3 (GOWN DISPOSABLE) ×3 IMPLANT
KIT RM TURNOVER CYSTO AR (KITS) ×3 IMPLANT
LOOP MONOPOLAR YLW (ELECTROSURGICAL) ×3 IMPLANT
MANIFOLD NEPTUNE II (INSTRUMENTS) ×3 IMPLANT
PACK CYSTO (CUSTOM PROCEDURE TRAY) ×3 IMPLANT
PLUG CATH AND CAP STER (CATHETERS) ×3 IMPLANT
SYRINGE IRR TOOMEY STRL 70CC (SYRINGE) ×3 IMPLANT
TUBE CONNECTING 12'X1/4 (SUCTIONS) ×1
TUBE CONNECTING 12X1/4 (SUCTIONS) ×2 IMPLANT
WATER STERILE IRR 3000ML UROMA (IV SOLUTION) ×3 IMPLANT
WATER STERILE IRR 500ML POUR (IV SOLUTION) ×3 IMPLANT

## 2016-03-09 NOTE — Op Note (Signed)
Preoperative diagnosis: 2 centimeter right lateral wall bladder tumor  Postoperative diagnosis: Same  Principal procedure: Cystoscopy, transurethral resection of 2 centimeter right lateral wall tumor, placement of epirubicin intravesically  Surgeon: Maud Rubendall  Anesthesia: Gen. With LMA  Complications: None  Specimen: Bladder tumor, to pathology  Drains: 20 French Foley catheter.  Indications: 83 year old male with cognitive dysfunction, recently presenting with hematuria.  He was found to have a right lateral wall bladder tumor.  This is consistent with urothelial cancer.  Patient, specifically his wife due to his cognitive impairment, was counseled regarding therapy.  This includes TURBT with placement of intravesical chemotherapy postoperatively to limit the risk of recurrence.  .  She understands and desires to proceed.  Description of procedure: The patient was properly identified in the holding area.  He received preoperative IV antibiotics.  He was taken to the operating room where general anesthesia was administered with LMA.  He was placed in the dorsolithotomy position.  Genitalia and perineum were prepped and draped.  Proper timeout was performed.  A 26 French resectoscope sheath was placed with the visual obturator.  Urethra was normal.  Prostate was minimally obstructed.  Somewhat of a large median lobe.  Ureteral orifices were normal in configuration and location.  Minimal trabeculations in the bladder.  Papillary tumor with some calcifications superficially was located on the right posterior bladder wall.  No other urothelial lesions were noted.  A few small stones were noted in the bladder.  These were irrigated free.  I then placed the resectoscope element with the cutting loop.  The bladder tumor was resected down to the muscular layer using the cutting current.  Water was used as irrigant.  It was evident that a significant amount of muscle had been resected.  There was no  evidence of perforation.  After resection of all the tumor, the tumor base was cauterized.  There was excellent hemostasis.  The tumor fragments were irrigated from the bladder and sent for pathology labeled "bladder tumor".  Following irrigation, the bladder tumor base was again inspected and found to be hemostatic.  At this point, the scope was removed.  I placed a 20 French Foley catheter in filled the balloon with 10 mL of water.  This was then plugged.  The patient was then awakened and taken to the PACU in stable condition.  He tolerated the procedure well.  In the PACU, 50 milligrams of epirubicin and 50 mL of diluent was placed in the bladder.  Left indwelling for 1 hour.

## 2016-03-09 NOTE — H&P (Signed)
Urology History and Physical Exam  CC: Bladder cancer  HPI: 83 year old male presents for TURBT and possible epirubicin placement. His recent office note follows:  This man comes in today because of an abnormality seen on CT scan of his abdomen and pelvis. That procedure was performed on 1/31/20018. It revealedan 18 mm right bladder wall mass. No other abnormalities were seen on that scan regarding his urinary tract other than a large prostate   He denies any gross hematuria. He does have lower urinary tract symptoms including frequency, urgency and slow stream, according to his wife. He does have cognitive dysfunction.     PMH: Past Medical History:  Diagnosis Date  . Anemia   . Bladder cancer ([place])    UROLOGIST-  Shailee Foots  . BPH with elevated PSA   . Full dentures   . 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 History of solitary pulmonary nodule    02-18-2007  s/p  R VATS w/ resection right lung nodule and lymph bx's--- per pathology Hamartoma and negative nodes  . Non-productive cough   . Osteoporosis   . Recent upper respiratory tract infection    admitted 02-15-2016 to 02-2016 for CAP  . Vascular dementia without behavioral disturbance   . Wears glasses     PSH: Past Surgical History:  Procedure Laterality Date  . APPENDECTOMY    . COLONOSCOPY  last one 09-27-2011  . LAPAROSCOPIC RIGHT HEMICOLECTOMY  08/20/2006  . [facility]  . TONSILLECTOMY  child  . VIDEO ASSISTED THORACOSCOPY (VATS)/WEDGE RESECTION Right 02/18/2007   Right Lung Nodule/  Opened thoracoscopic biopsy right hilar lymph nodes    Allergies: No Known Allergies  Medications: No prescriptions prior to admission.     Social History: Social History   Social History  . Marital status: Married    Spouse name: N/A  . Number of children: N/A  . Years of  education: N/A   Occupational History  . Not on file.   Social History Main Topics  . Smoking status: Former Smoker    Years: 20.00    Types: Cigarettes    Quit date: 03/06/1975  . Smokeless tobacco: Never Used  . Alcohol use No  . Drug use: No  . Sexual activity: Not on file   Other Topics Concern  . Not on file   Social History Narrative  . No narrative on file    Family History: Family History  Problem Relation Age of Onset  . Colon cancer Neg Hx     Review of Systems: GU Review Male: Patient reports hard to postpone urination and trouble starting your stream. Patient denies frequent urination, burning/ pain with urination, get up at night to urinate, leakage of urine, stream starts and stops, have to strain to urinate , erection problems, and penile pain. Gastrointestinal (Upper): Patient denies nausea, vomiting, and indigestion/ heartburn. Gastrointestinal (Lower): Patient denies diarrhea and constipation. Constitutional: Patient denies fever, night sweats, weight loss, and fatigue. Skin: Patient denies skin rash/ lesion and itching. Eyes: Patient denies blurred vision and double vision. Ears/ Nose/ Throat: Patient denies sore throat and sinus problems. Hematologic/Lymphatic: Patient denies swollen glands and easy bruising. Cardiovascular: Patient denies leg swelling and chest pains. Respiratory: Patient denies cough and shortness of breath. Endocrine: Patient denies excessive thirst. Musculoskeletal: Patient denies back pain and joint pain. Neurological: Patient denies  headaches and dizziness. Psychologic: Patient reports anxiety. Patient denies depression.                  Physical Exam: @VITALS2 @ General: No acute distress.  Awake. Head:  Normocephalic.  Atraumatic. ENT:  EOMI.  Mucous membranes moist Neck:  Supple.  No lymphadenopathy. CV:  S1 present. S2 present. Regular rate. Pulmonary: Equal effort bilaterally.  Clear to auscultation bilaterally. Abdomen: Soft.   Nontender to palpation. Skin:  Normal turgor.  No visible rash. Extremity: No gross deformity of bilateral upper extremities.  No gross deformity of                             lower extremities. Neurologic: Alert. Appropriate mood.    Studies:  No results for input(s): HGB, WBC, PLT in the last 72 hours.  No results for input(s): NA, K, CL, CO2, BUN, CREATININE, CALCIUM, GFRNONAA, GFRAA in the last 72 hours.  Invalid input(s): MAGNESIUM   No results for input(s): INR, APTT in the last 72 hours.  Invalid input(s): PT   Invalid input(s): ABG    Assessment:  Urothelial carcinoma of right bladder wall  Plan: TURBT, placement of epirubicin

## 2016-03-09 NOTE — Anesthesia Postprocedure Evaluation (Signed)
Anesthesia Post Note  Patient: Brett Santiago  Procedure(s) Performed: Procedure(s) (LRB): TRANSURETHRAL RESECTION OF BLADDER TUMOR (TURBT) AND EPIRUBICIN INSTILLATION (N/A)  Patient location during evaluation: PACU Anesthesia Type: General Level of consciousness: awake and alert Pain management: pain level controlled Vital Signs Assessment: post-procedure vital signs reviewed and stable Respiratory status: spontaneous breathing, nonlabored ventilation, respiratory function stable and patient connected to nasal cannula oxygen Cardiovascular status: blood pressure returned to baseline and stable Postop Assessment: no signs of nausea or vomiting Anesthetic complications: no       Last Vitals:  Vitals:   03/09/16 1300 03/09/16 1315  BP: 129/67 138/82  Pulse: 67 65  Resp: 16 17  Temp: 36.5 C     Last Pain:  Vitals:   03/09/16 1033  TempSrc: Oral                 [name] S

## 2016-03-09 NOTE — Discharge Instructions (Signed)
Transurethral Resection of Bladder Tumor (TURBT)  °Definition:  °Transurethral Resection of the Bladder Tumor is a surgical procedure used to diagnose and remove tumors within the bladder. TURBT is the most common treatment for early stage bladder cancer.  ° °General instructions:  °Your recent bladder surgery requires very little post hospital care but some definite precautions.  °Despite the fact that no skin incisions were used, the area around the tumor removal site is raw and covered with scabs to promote healing and prevent bleeding. Certain precautions are needed to insure that the scabs are not disturbed over the next 2-4 weeks while the healing proceeds.  °Because the raw surface inside your bladder and the irritating effects of urine you may expect frequency of urination and/or urgency (a stronger desire to urinate) and perhaps even getting up at night more often. This will usually resolve or improve slowly over the healing period. You may see some blood in your urine over the first 6 weeks. Do not be alarmed, even if the urine was clear for a while. Get off your feet and drink lots of fluids until clearing occurs. If you start to pass clots or don't improve call us.  ° °Diet:  °You may return to your normal diet immediately. Because of the raw surface of your bladder, alcohol, spicy foods, foods high in acid and drinks with caffeine may cause irritation or frequency and should be used in moderation. To keep your urine flowing freely and avoid constipation, drink plenty of fluids during the day (8-10 glasses). Tip: Avoid cranberry juice because it is very acidic. °  °Activity:  °Your physical activity needs to be restricted somewhat.  We suggest that you reduce your activity under the circumstances until the bleeding has stopped. Heavy lifting (greater than 20 lbs.) and heavy exertion should be limited for 2-3 weeks. ° °Bowels:  °It is important to keep your bowels regular during the postoperative period.  Straining with bowel movements can cause bleeding. A bowel movement every other day is reasonable. Use a mild laxative if needed, such as milk of magnesia 2-3 tablespoons, or 2 Dulcolax tablets. Call if you continue to have problems. If you had been taking narcotics for pain, before, during or after your surgery, you may be constipated.  ° °Medication:  °You should resume your pre-surgery medications unless told not to. In addition you may be given an antibiotic to prevent or treat infection. Antibiotics are not always necessary. All medication should be taken as prescribed until the bottles are finished unless you are having an unusual reaction to one of the drugs.  °General Anesthetic, Adult  °A doctor specialized in giving anesthesia (anesthesiologist) or a nurse specialized in giving anesthesia (nurse anesthetist) gives medicine that makes you sleep while a procedure is performed (general anesthetic). Once the general anesthetic has been administered, you will be in a sleeplike state in which you feel no pain. After having a general anesthetic you may feel:  °Dizzy.  °Weak.  °Drowsy.  °Confused.  °These feelings are normal and can be expected to last for up to 24 hours after the procedure. ° ° °LET YOUR CAREGIVER KNOW ABOUT:  °Allergies you have.  °Medications you are taking, including herbs, eye drops, over the counter medications, dietary supplements, and creams.  °Previous problems you have had with anesthetics or numbing medicines.  °Use of cigarettes, alcohol, or illicit drugs.  °Possibility of pregnancy, if this applies.  °History of bleeding or blood disorders, including blood clots and clotting   disorders.  °Previous surgeries you have had and types of anesthetics you have received.  °Family medical history, especially anesthetic problems.  °Other health problems.  ° °AFTER THE PROCEDURE  °After surgery, you will be taken to the recovery area where a nurse will monitor your progress. You will be allowed  to go home when you are awake, stable, taking fluids well, and without serious pain or complications.  °For the first 24 hours following an anesthetic:  °Have a responsible person with you.  °Do not drive a car. If you are alone, do not take public transportation.  °Do not engage in strenuous activity. You may usually resume normal activities the next day, or as advised by your caregiver.  °Do not drink alcohol.  °Do not take medicine that has not been prescribed by your caregiver.  °Do not sign important papers or make important decisions as your judgement may be impaired.  °You may resume a normal diet as directed.  °Change bandages (dressings) as directed.  °Only take over-the-counter or prescription medicines for pain, discomfort, or fever as directed by your caregiver.  °If you have questions or problems that seem related to the anesthetic, call the hospital and ask for the anesthetist, anesthesiologist, or anesthesia department.  ° °SEEK IMMEDIATE MEDICAL CARE IF:  °You develop a rash.  °You have difficulty breathing.  °You have chest pain.  °You have allergic problems.  °You have uncontrolled nausea.  °You have uncontrolled vomiting.  °You develop any serious bleeding, especially from the incision site.  °Document Released: 04/10/2007 Document Revised: 09/13/2010 Document Reviewed: 05/04/2010  °Exit[employer]® Patient Information ©2012 [employer].  ° ° °

## 2016-03-09 NOTE — Transfer of Care (Signed)
Last Vitals:  Vitals:   03/09/16 1033  BP: (!) 142/57  Pulse: 65  Resp: 16  Temp: 36.5 C    Last Pain:  Vitals:   03/09/16 1033  TempSrc: Oral      Patients Stated Pain Goal:  (unable to select) (03/09/16 1052)  Immediate Anesthesia Transfer of Care Note  Patient: Brett Santiago  Procedure(s) Performed: Procedure(s) (LRB): TRANSURETHRAL RESECTION OF BLADDER TUMOR (TURBT) AND EPIRUBICIN INSTILLATION (N/A)  Patient Location: PACU  Anesthesia Type: General  Level of Consciousness: awake, alert  and oriented  Airway & Oxygen Therapy: Patient Spontanous Breathing and Patient connected to face mask oxygen  Post-op Assessment: Report given to PACU RN and Post -op Vital signs reviewed and stable  Post vital signs: Reviewed and stable  Complications: No apparent anesthesia complications

## 2016-03-09 NOTE — Anesthesia Procedure Notes (Signed)
Procedure Name: LMA Insertion Date/Time: 03/09/2016 12:37 PM Performed by: Myrtie Soman Pre-anesthesia Checklist: Patient identified, Emergency Drugs available, Suction available and Patient being monitored Patient Re-evaluated:Patient Re-evaluated prior to inductionOxygen Delivery Method: Circle system utilized Preoxygenation: Pre-oxygenation with 100% oxygen Intubation Type: IV induction Ventilation: Mask ventilation without difficulty LMA: LMA inserted LMA Size: 4.0 Number of attempts: 1 Airway Equipment and Method: Bite block Placement Confirmation: positive ETCO2 Tube secured with: Tape Dental Injury: Teeth and Oropharynx as per pre-operative assessment

## 2016-03-09 NOTE — Anesthesia Preprocedure Evaluation (Signed)
Anesthesia Evaluation  Patient identified by MRN, date of birth, ID band Patient awake    Reviewed: Allergy & Precautions, NPO status , Patient's Chart, lab work & pertinent test results  Airway Mallampati: II  TM Distance: >3 FB Neck ROM: Full    Dental no notable dental hx.    Pulmonary pneumonia, resolved, former smoker,    Pulmonary exam normal breath sounds clear to auscultation       Cardiovascular negative cardio ROS Normal cardiovascular exam Rhythm:Regular Rate:Normal     Neuro/Psych negative neurological ROS  negative psych ROS   GI/Hepatic negative GI ROS, Neg liver ROS,   Endo/Other  negative endocrine ROS  Renal/GU negative Renal ROS  negative genitourinary   Musculoskeletal negative musculoskeletal ROS (+)   Abdominal   Peds negative pediatric ROS (+)  Hematology negative hematology ROS (+)   Anesthesia Other Findings   Reproductive/Obstetrics negative OB ROS                             Anesthesia Physical Anesthesia Plan  ASA: III  Anesthesia Plan: General   Post-op Pain Management:    Induction: Intravenous  Airway Management Planned: LMA  Additional Equipment:   Intra-op Plan:   Post-operative Plan: Extubation in OR  Informed Consent: I have reviewed the patients History and Physical, chart, labs and discussed the procedure including the risks, benefits and alternatives for the proposed anesthesia with the patient or authorized representative who has indicated his/her understanding and acceptance.   Dental advisory given  Plan Discussed with: CRNA and Surgeon  Anesthesia Plan Comments:         Anesthesia Quick Evaluation

## 2016-03-10 DIAGNOSIS — C672 Malignant neoplasm of lateral wall of bladder: Secondary | ICD-10-CM | POA: Diagnosis not present

## 2016-03-10 NOTE — Discharge Summary (Signed)
Date of admission: 03/09/2016  Date of discharge: 03/10/2016  Admission diagnosis: Bladder tumor  Discharge diagnosis: same, s/p TURBT  Secondary diagnoses:  Patient Active Problem List   Diagnosis Date Noted  . 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 Pressure injury of skin 02/17/2016  . Weakness generalized 02/16/2016  . Vascular dementia without behavioral disturbance   . CAP (community acquired pneumonia) 02/15/2016  . COLON CANCER 04/29/2007  . COLONIC POLYPS 04/29/2007    History and Physical: For full details, please see admission history and physical. Briefly, Brett Santiago is a 83 y.o. year old patient with a small bladder tumor.   Hospital Course: Patient tolerated the procedure well.  He was then transferred to the floor after an uneventful PACU stay.  His hospital course was uncomplicated.  On POD#1 he had met discharge criteria: was eating a regular diet, was up and ambulating independently,  pain was well controlled, was voiding without a catheter, and was ready to for discharge.   Laboratory values:  No results for input(s): WBC, HGB, HCT in the last 72 hours. No results for input(s): NA, K, CL, CO2, GLUCOSE, BUN, CREATININE, CALCIUM in the last 72 hours. No results for input(s): LABPT, INR in the last 72 hours. No results for input(s): LABURIN in the last 72 hours. Results for orders placed or performed during the hospital encounter of 02/15/16  Urine culture     Status: None   Collection Time: 02/15/16  9:04 PM  Result Value Ref Range Status   Specimen Description URINE, CLEAN CATCH  Final   Special Requests NONE  Final   Culture   Final    NO GROWTH Performed at [facility], [place] [address], [place], [place] [address]    Report Status 02/17/2016 FINAL  Final  Blood culture (routine x 2)     Status: None   Collection Time: 02/16/16  1:31 AM  Result Value Ref Range Status   Specimen Description BLOOD RIGHT HAND  Final   Special Requests  BOTTLES DRAWN AEROBIC AND ANAEROBIC 5ML  Final   Culture   Final    NO GROWTH 5 DAYS Performed at [facility], [place] [address], [place], [place] [address]    Report Status 02/21/2016 FINAL  Final  Blood culture (routine x 2)     Status: None   Collection Time: 02/16/16  1:31 AM  Result Value Ref Range Status   Specimen Description BLOOD LEFT HAND  Final   Special Requests IN PEDIATRIC BOTTLE 4ML  Final   Culture   Final    NO GROWTH 5 DAYS Performed at [facility], [place] [address], [place],  [address]    Report Status 02/21/2016 FINAL  Final    Disposition: Home  Discharge instruction: The patient was instructed to be ambulatory but told to refrain from heavy lifting, strenuous activity, or driving.   Discharge medications:  Allergies as of 03/10/2016   No Known Allergies     Medication List    STOP taking these medications   donepezil 5 MG tablet Commonly known as:  ARICEPT     TAKE these medications   MULTIVITAMIN PO Take 1 tablet by mouth daily.   QUEtiapine 25 MG tablet Commonly known as:  SEROQUEL Take 25 mg by mouth at bedtime.   tamsulosin 0.4 MG Caps capsule Commonly known as:  FLOMAX Take 1 capsule (0.4 mg total) by mouth daily. What changed:  when to take this  VITAMIN D PO Take 1 tablet by mouth daily.       Followup:  Follow-up Information    DAHLSTEDT, Lillette Boxer, MD.   Specialty:  Urology Why:  we will call you for appt Contact information: [place] [id number] [phone]

## 2016-03-10 NOTE — Progress Notes (Signed)
Urology Inpatient Progress Report  BLADDER CANCER  Procedure(s): TRANSURETHRAL RESECTION OF BLADDER TUMOR (TURBT) AND EPIRUBICIN INSTILLATION  1 Day Post-Op   Intv/Subj: No acute events overnight. Patient is without complaint.  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 0.45 % sodium chloride infusion   Intravenous Continuous Franchot Gallo, MD 50 mL/hr at 03/10/16 0534    . acetaminophen (TYLENOL) tablet 650 mg  650 mg Oral Q4H PRN Franchot Gallo, MD      . cholecalciferol (VITAMIN D) tablet 1,000 Units  1,000 Units Oral Daily Franchot Gallo, MD      . donepezil (ARICEPT) tablet 5 mg  5 mg Oral q morning - 10a Franchot Gallo, MD      . HYDROcodone-acetaminophen (NORCO/VICODIN) 5-325 MG per tablet 1-2 tablet  1-2 tablet Oral Q4H PRN Franchot Gallo, MD   1 tablet at 03/09/16 2054  . LORazepam (ATIVAN) tablet 0.5 mg  0.5 mg Oral Q6H PRN Franchot Gallo, MD   0.5 mg at 03/09/16 2054  . oxybutynin (DITROPAN) tablet 5 mg  5 mg Oral Q8H PRN Franchot Gallo, MD      . QUEtiapine (SEROQUEL) tablet 25 mg  25 mg Oral QHS Franchot Gallo, MD   25 mg at 03/09/16 2007  . senna (SENOKOT) tablet 8.6 mg  1 tablet Oral BID Franchot Gallo, MD   8.6 mg at 03/09/16 2007  . sulfamethoxazole-trimethoprim (BACTRIM DS,SEPTRA DS) 800-160 MG per tablet 1 tablet  1 tablet Oral Q12H Franchot Gallo, MD   1 tablet at 03/09/16 2007  . tamsulosin (FLOMAX) capsule 0.4 mg  0.4 mg Oral QPC breakfast Franchot Gallo, MD       Facility-Administered Medications Ordered in Other Encounters  Medication Dose Route Frequency Provider Last Rate Last Dose  . epirubicin (ELLENCE) 50 mg in sodium chloride 0.9 % bladder instillation  50 mg Bladder Instillation Once Franchot Gallo, MD         Objective: Vital: Vitals:   03/09/16 1530 03/09/16 1602 03/09/16 2054 03/10/16  0525  BP: 136/61 132/68 130/68 (!) 116/58  Pulse: 74 71 71 73  Resp: (!) 23 18 18 19   Temp:  97.9 F (36.6 C) 98.9 F (37.2 C) 98.2 F (36.8 C)  TempSrc:   Oral Oral  SpO2: 100% 100% 100% 98%  Weight:      Height:       I/Os: I/O last 3 completed shifts: In: 2237.5 [P.O.:600; I.V.:1637.5] Out: 1310 [Urine:1300; Blood:10]  Physical Exam:  General: Patient is in no apparent distress Lungs: Normal respiratory effort, chest expands symmetrically. GI: The abdomen is soft and nontender without mass. Foley: removed prior to my rounding today  Ext: lower extremities symmetric  Lab Results: No results for input(s): WBC, HGB, HCT in the last 72 hours. No results for input(s): NA, K, CL, CO2, GLUCOSE, BUN, CREATININE, CALCIUM in the last 72 hours. No results for input(s): LABPT, INR in the last 72 hours. No results for input(s): LABURIN in the last 72 hours. Results for orders placed or performed during the hospital encounter of 02/15/16  Urine culture     Status: None   Collection Time: 02/15/16  9:04 PM  Result Value Ref Range Status   Specimen Description URINE, CLEAN CATCH  Final   Special Requests NONE  Final   Culture   Final    NO GROWTH Performed at [facility], [place]  [address], [place], [place] [address]    Report Status 02/17/2016 FINAL  Final  Blood culture (routine x 2)     Status: None   Collection Time: 02/16/16  1:31 AM  Result Value Ref Range Status   Specimen Description BLOOD RIGHT HAND  Final   Special Requests BOTTLES DRAWN AEROBIC AND ANAEROBIC 5ML  Final   Culture   Final    NO GROWTH 5 DAYS Performed at [facility], [place] [address], [place], [place] [address]    Report Status 02/21/2016 FINAL  Final  Blood culture (routine x 2)     Status: None   Collection Time: 02/16/16  1:31 AM  Result Value Ref Range Status   Specimen Description BLOOD LEFT HAND  Final   Special Requests IN PEDIATRIC BOTTLE 4ML  Final   Culture   Final    NO GROWTH  5 DAYS Performed at [facility], [place] [address], [place], [place] [address]    Report Status 02/21/2016 FINAL  Final    Studies/Results: No results found.  Assessment: Procedure(s): TRANSURETHRAL RESECTION OF BLADDER TUMOR (TURBT) AND EPIRUBICIN INSTILLATION, 1 Day Post-Op  doing well.  Plan: Voiding trial Home this AM.   Louis Meckel, MD Urology 03/10/2016, 8:29 AM

## 2016-03-12 ENCOUNTER — Encounter (HOSPITAL_BASED_OUTPATIENT_CLINIC_OR_DEPARTMENT_OTHER): Payer: Self-pay | Admitting: Urology

## 2016-04-24 ENCOUNTER — Other Ambulatory Visit: Payer: Self-pay | Admitting: Urology

## 2016-05-14 ENCOUNTER — Encounter (HOSPITAL_BASED_OUTPATIENT_CLINIC_OR_DEPARTMENT_OTHER): Payer: Self-pay | Admitting: *Deleted

## 2016-05-14 NOTE — Progress Notes (Signed)
SPOKE W/ PT'S WIFE.  PT HAS DEMENITA.  NPO AFTER MN.  ARRIVE AT 1115.  WILL TAKE AM MEDS W/ SIPS OF WATER DOS.  NEEDS HG.

## 2016-05-25 ENCOUNTER — Encounter (HOSPITAL_COMMUNITY)
Admission: RE | Disposition: A | Discharge status: Home / Self Care | Payer: Self-pay | Source: Ambulatory Visit | Attending: Urology

## 2016-05-25 ENCOUNTER — Encounter (HOSPITAL_BASED_OUTPATIENT_CLINIC_OR_DEPARTMENT_OTHER): Payer: Self-pay | Admitting: *Deleted

## 2016-05-25 ENCOUNTER — Ambulatory Visit (HOSPITAL_BASED_OUTPATIENT_CLINIC_OR_DEPARTMENT_OTHER): Payer: Medicare Other | Admitting: Anesthesiology

## 2016-05-25 ENCOUNTER — Observation Stay (HOSPITAL_BASED_OUTPATIENT_CLINIC_OR_DEPARTMENT_OTHER)
Admission: RE | Admit: 2016-05-25 | Discharge: 2016-05-26 | Disposition: A | Discharge status: Home / Self Care | Payer: Medicare Other | Source: Ambulatory Visit | Attending: Urology | Admitting: Urology

## 2016-05-25 DIAGNOSIS — Z8551 Personal history of malignant neoplasm of bladder: Secondary | ICD-10-CM | POA: Diagnosis not present

## 2016-05-25 DIAGNOSIS — Z85038 Personal history of other malignant neoplasm of large intestine: Secondary | ICD-10-CM | POA: Insufficient documentation

## 2016-05-25 DIAGNOSIS — N323 Diverticulum of bladder: Secondary | ICD-10-CM | POA: Insufficient documentation

## 2016-05-25 DIAGNOSIS — Z9889 Other specified postprocedural states: Secondary | ICD-10-CM | POA: Diagnosis not present

## 2016-05-25 DIAGNOSIS — F0390 Unspecified dementia without behavioral disturbance: Secondary | ICD-10-CM | POA: Insufficient documentation

## 2016-05-25 DIAGNOSIS — Z9049 Acquired absence of other specified parts of digestive tract: Secondary | ICD-10-CM | POA: Insufficient documentation

## 2016-05-25 DIAGNOSIS — C679 Malignant neoplasm of bladder, unspecified: Principal | ICD-10-CM | POA: Insufficient documentation

## 2016-05-25 DIAGNOSIS — N400 Enlarged prostate without lower urinary tract symptoms: Secondary | ICD-10-CM | POA: Insufficient documentation

## 2016-05-25 DIAGNOSIS — Z87891 Personal history of nicotine dependence: Secondary | ICD-10-CM | POA: Diagnosis not present

## 2016-05-25 DIAGNOSIS — D494 Neoplasm of unspecified behavior of bladder: Secondary | ICD-10-CM | POA: Diagnosis present

## 2016-05-25 HISTORY — DX: Delirium due to known physiological condition: F05

## 2016-05-25 HISTORY — PX: TRANSURETHRAL RESECTION OF BLADDER TUMOR: SHX2575

## 2016-05-25 SURGERY — TURBT (TRANSURETHRAL RESECTION OF BLADDER TUMOR)
Anesthesia: General

## 2016-05-25 MED ORDER — DEXAMETHASONE SODIUM PHOSPHATE 4 MG/ML IJ SOLN
INTRAMUSCULAR | Status: DC | PRN
  Administered 2016-05-25: 10 mg via INTRAVENOUS

## 2016-05-25 MED ORDER — ONDANSETRON HCL 4 MG/2ML IJ SOLN
INTRAMUSCULAR | Status: AC
  Filled 2016-05-25: qty 2

## 2016-05-25 MED ORDER — LIDOCAINE 2% (20 MG/ML) 5 ML SYRINGE
INTRAMUSCULAR | Status: DC | PRN
  Administered 2016-05-25: 100 mg via INTRAVENOUS

## 2016-05-25 MED ORDER — DEXTROSE-NACL 5-0.45 % IV SOLN
INTRAVENOUS | Status: DC
  Administered 2016-05-25: 18:00:00 via INTRAVENOUS

## 2016-05-25 MED ORDER — CEFAZOLIN SODIUM-DEXTROSE 2-3 GM-% IV SOLR
INTRAVENOUS | Status: DC | PRN
  Administered 2016-05-25: 2 g via INTRAVENOUS

## 2016-05-25 MED ORDER — DEXAMETHASONE SODIUM PHOSPHATE 10 MG/ML IJ SOLN
INTRAMUSCULAR | Status: AC
  Filled 2016-05-25: qty 1

## 2016-05-25 MED ORDER — CEFAZOLIN SODIUM-DEXTROSE 2-4 GM/100ML-% IV SOLN
INTRAVENOUS | Status: AC
  Filled 2016-05-25: qty 100

## 2016-05-25 MED ORDER — ROCURONIUM BROMIDE 100 MG/10ML IV SOLN
INTRAVENOUS | Status: DC | PRN
  Administered 2016-05-25: 20 mg via INTRAVENOUS

## 2016-05-25 MED ORDER — FENTANYL CITRATE (PF) 100 MCG/2ML IJ SOLN
INTRAMUSCULAR | Status: AC
  Filled 2016-05-25: qty 2

## 2016-05-25 MED ORDER — ACETAMINOPHEN 325 MG PO TABS: 650.0000 mg | ORAL_TABLET | ORAL | Status: DC | PRN

## 2016-05-25 MED ORDER — HALOPERIDOL 1 MG PO TABS
1.0000 mg | ORAL_TABLET | Freq: Four times a day (QID) | ORAL | Status: DC | PRN
  Administered 2016-05-26: 1 mg via ORAL
  Filled 2016-05-25 (×2): qty 1

## 2016-05-25 MED ORDER — LACTATED RINGERS IV SOLN
INTRAVENOUS | Status: DC | PRN
  Administered 2016-05-25: 13:00:00 via INTRAVENOUS

## 2016-05-25 MED ORDER — PROPOFOL 10 MG/ML IV BOLUS
INTRAVENOUS | Status: DC | PRN
  Administered 2016-05-25: 160 mg via INTRAVENOUS

## 2016-05-25 MED ORDER — EPHEDRINE SULFATE-NACL 50-0.9 MG/10ML-% IV SOSY
PREFILLED_SYRINGE | INTRAVENOUS | Status: DC | PRN
  Administered 2016-05-25 (×2): 10 mg via INTRAVENOUS

## 2016-05-25 MED ORDER — HALOPERIDOL LACTATE 5 MG/ML IJ SOLN: 1.0000 mg | Freq: Four times a day (QID) | INTRAMUSCULAR | Status: DC | PRN

## 2016-05-25 MED ORDER — PROPOFOL 10 MG/ML IV BOLUS
INTRAVENOUS | Status: AC
  Filled 2016-05-25: qty 20

## 2016-05-25 MED ORDER — QUETIAPINE FUMARATE 25 MG PO TABS
25.0000 mg | ORAL_TABLET | Freq: Every day | ORAL | Status: DC
  Administered 2016-05-25: 25 mg via ORAL
  Filled 2016-05-25: qty 1

## 2016-05-25 MED ORDER — FENTANYL CITRATE (PF) 100 MCG/2ML IJ SOLN
INTRAMUSCULAR | Status: DC | PRN
  Administered 2016-05-25: 50 ug via INTRAVENOUS

## 2016-05-25 MED ORDER — ONDANSETRON HCL 4 MG/2ML IJ SOLN
INTRAMUSCULAR | Status: DC | PRN
Start: 2016-05-25 — End: 2016-05-25
  Administered 2016-05-25: 4 mg via INTRAVENOUS

## 2016-05-25 MED ORDER — SENNA 8.6 MG PO TABS
1.0000 | ORAL_TABLET | Freq: Two times a day (BID) | ORAL | Status: DC
  Administered 2016-05-25 – 2016-05-26 (×2): 8.6 mg via ORAL
  Filled 2016-05-25 (×2): qty 1

## 2016-05-25 MED ORDER — DONEPEZIL HCL 5 MG PO TABS
5.0000 mg | ORAL_TABLET | Freq: Every morning | ORAL | Status: DC
Start: 2016-05-26 — End: 2016-05-26
  Administered 2016-05-26: 5 mg via ORAL
  Filled 2016-05-25: qty 1

## 2016-05-25 MED ORDER — OXYCODONE HCL 5 MG PO TABS
5.0000 mg | ORAL_TABLET | ORAL | Status: DC | PRN
  Administered 2016-05-25: 5 mg via ORAL
  Filled 2016-05-25: qty 1

## 2016-05-25 SURGICAL SUPPLY — 26 items
BAG DRAIN URO-CYSTO SKYTR STRL (DRAIN) ×3 IMPLANT
BAG URINE DRAINAGE (UROLOGICAL SUPPLIES) IMPLANT
BAG URINE LEG 19OZ MD ST LTX (BAG) IMPLANT
CATH FOLEY 2WAY SLVR  5CC 20FR (CATHETERS) ×2
CATH FOLEY 2WAY SLVR  5CC 22FR (CATHETERS)
CATH FOLEY 2WAY SLVR 5CC 20FR (CATHETERS) ×1 IMPLANT
CATH FOLEY 2WAY SLVR 5CC 22FR (CATHETERS) IMPLANT
CATH FOLEY 3WAY 30CC 22F (CATHETERS) IMPLANT
CLOTH BEACON ORANGE TIMEOUT ST (SAFETY) ×3 IMPLANT
ELECT REM PT RETURN 9FT ADLT (ELECTROSURGICAL) ×3
ELECTRODE REM PT RTRN 9FT ADLT (ELECTROSURGICAL) ×1 IMPLANT
EVACUATOR MICROVAS BLADDER (UROLOGICAL SUPPLIES) ×3 IMPLANT
GLOVE BIO SURGEON STRL SZ8 (GLOVE) ×3 IMPLANT
GOWN STRL REUS W/ TWL XL LVL3 (GOWN DISPOSABLE) ×1 IMPLANT
GOWN STRL REUS W/TWL XL LVL3 (GOWN DISPOSABLE) ×5 IMPLANT
HOLDER FOLEY CATH W/STRAP (MISCELLANEOUS) IMPLANT
KIT RM TURNOVER CYSTO AR (KITS) ×3 IMPLANT
LOOP CUT BIPOLAR 24F LRG (ELECTROSURGICAL) ×3 IMPLANT
LOOP MONOPOLAR YLW (ELECTROSURGICAL) IMPLANT
MANIFOLD NEPTUNE II (INSTRUMENTS) ×3 IMPLANT
PACK CYSTO (CUSTOM PROCEDURE TRAY) ×3 IMPLANT
PLUG CATH AND CAP STER (CATHETERS) IMPLANT
SET ASPIRATION TUBING (TUBING) IMPLANT
SYRINGE IRR TOOMEY STRL 70CC (SYRINGE) ×3 IMPLANT
TUBE CONNECTING 12'X1/4 (SUCTIONS)
TUBE CONNECTING 12X1/4 (SUCTIONS) IMPLANT

## 2016-05-25 NOTE — Discharge Instructions (Signed)

## 2016-05-25 NOTE — Transfer of Care (Signed)
Immediate Anesthesia Transfer of Care Note  Patient: Brett Santiago  Procedure(s) Performed: Procedure(s): REPEAT TRANSURETHRAL RESECTION OF BLADDER TUMOR (TURBT) WITH LOOP (N/A)  Patient Location: PACU  Anesthesia Type:General  Level of Consciousness: awake, alert , oriented and patient cooperative  Airway & Oxygen Therapy: Patient Spontanous Breathing and Patient connected to nasal cannula oxygen  Post-op Assessment: Report given to RN and Post -op Vital signs reviewed and stable  Post vital signs: Reviewed and stable  Last Vitals:  Vitals:   05/25/16 1100  BP: (!) 133/49  Pulse: 66  Resp: 17  Temp: 36.8 C    Last Pain:  Vitals:   05/25/16 1132  TempSrc: Oral         Complications: No apparent anesthesia complications

## 2016-05-25 NOTE — Interval H&P Note (Signed)
History and Physical Interval Note:  05/25/2016 1:03 PM  Brett Santiago  has presented today for surgery, with the diagnosis of BLADDER CANCER  The various methods of treatment have been discussed with the patient and family. After consideration of risks, benefits and other options for treatment, the patient has consented to  Procedure(s): REPEAT TRANSURETHRAL RESECTION OF BLADDER TUMOR (TURBT) WITH LOOP (N/A) as a surgical intervention .  The patient's history has been reviewed, patient examined, no change in status, stable for surgery.  I have reviewed the patient's chart and labs.  Questions were answered to the patient's satisfaction.     Jorja Loa

## 2016-05-25 NOTE — Anesthesia Preprocedure Evaluation (Addendum)
Anesthesia Evaluation  Patient identified by MRN, date of birth, ID band Patient awake    Reviewed: Allergy & Precautions, NPO status , Patient's Chart, lab work & pertinent test results  Airway Mallampati: II  TM Distance: >3 FB     Dental   Pulmonary pneumonia, former smoker,    breath sounds clear to auscultation       Cardiovascular negative cardio ROS   Rhythm:Regular Rate:Normal     Neuro/Psych    GI/Hepatic negative GI ROS, Neg liver ROS,   Endo/Other  negative endocrine ROS  Renal/GU negative Renal ROS     Musculoskeletal   Abdominal   Peds  Hematology  (+) anemia ,   Anesthesia Other Findings   Reproductive/Obstetrics                             Anesthesia Physical Anesthesia Plan  ASA: III  Anesthesia Plan: General   Post-op Pain Management:    Induction: Intravenous  Airway Management Planned: LMA  Additional Equipment:   Intra-op Plan:   Post-operative Plan: Extubation in OR  Informed Consent: I have reviewed the patients History and Physical, chart, labs and discussed the procedure including the risks, benefits and alternatives for the proposed anesthesia with the patient or authorized representative who has indicated his/her understanding and acceptance.   Dental advisory given  Plan Discussed with: CRNA and Anesthesiologist  Anesthesia Plan Comments:         Anesthesia Quick Evaluation

## 2016-05-25 NOTE — H&P (Signed)
H&P  Chief Complaint: ladder cancer  History of Present Illness: This man returns for repeat TURBT.He underwent TURBT in late February of this year.. This revealed high-grade invasive (T1) urothelial carcinoma. He spent the night because of cognitive issues. He's had no problems that time.   Past Medical History:  Diagnosis Date  . Anemia   . Bladder cancer ([place])    UROLOGIST-  Lindsy Cerullo  . BPH with elevated PSA   . Full dentures   . 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 History of solitary pulmonary nodule    02-18-2007  s/p  R VATS w/ resection right lung nodule and lymph bx's--- per pathology Hamartoma and negative nodes  . Osteoporosis   . Sundowning   . Vascular dementia without behavioral disturbance   . Wears glasses     Past Surgical History:  Procedure Laterality Date  . APPENDECTOMY    . COLONOSCOPY  last one 09-27-2011  . LAPAROSCOPIC RIGHT HEMICOLECTOMY  08/20/2006  . [place]  . TONSILLECTOMY  child  . TRANSURETHRAL RESECTION OF BLADDER TUMOR N/A 03/09/2016   Procedure: TRANSURETHRAL RESECTION OF BLADDER TUMOR (TURBT) AND EPIRUBICIN INSTILLATION;  Surgeon: Franchot Gallo, MD;  Location: [facility];  Service: Urology;  Laterality: N/A;  . VIDEO ASSISTED THORACOSCOPY (VATS)/WEDGE RESECTION Right 02/18/2007   Right Lung Nodule/  Opened thoracoscopic biopsy right hilar lymph nodes    Home Medications:  Allergies as of 05/25/2016   No Known Allergies     Medication List    Notice   Cannot display discharge medications because the patient has not yet been admitted.     Allergies: No Known Allergies  Family History  Problem Relation Age of Onset  . Colon cancer Neg Hx     Social History:  reports that he quit smoking about 41 years ago. His smoking use included Cigarettes. He quit after 20.00 years  of use. He has never used smokeless tobacco. He reports that he does not drink alcohol or use drugs.  ROS: A complete review of systems was performed.  All systems are negative except for pertinent findings as noted.  Physical Exam:  Vital signs in last 24 hours:   Constitutional:  Alert and oriented, No acute distress Cardiovascular: Regular rate and rhythm, No JVD Respiratory: Normal respiratory effort, Lungs clear bilaterally GI: Abdomen is soft, nontender, nondistended, no abdominal masses Genitourinary: No CVAT. Normal male phallus, testes are descended bilaterally and non-tender and without masses, scrotum is normal in appearance without lesions or masses, perineum is normal on inspection. Lymphatic: No lymphadenopathy Neurologic: Grossly intact, no focal deficits. Cognitive impairment. Psychiatric: Normal mood and affect  Laboratory Data:  No results for input(s): WBC, HGB, HCT, PLT in the last 72 hours.  No results for input(s): NA, K, CL, GLUCOSE, BUN, CALCIUM, CREATININE in the last 72 hours.  Invalid input(s): CO3   No results found for this or any previous visit (from the past 24 hour(s)). No results found for this or any previous visit (from the past 240 hour(s)).  Renal Function: No results for input(s): CREATININE in the last 168 hours. CrCl cannot be calculated (Patient's most recent lab result is older than the maximum 21 days allowed.).  Radiologic Imaging: No results found.  Impression/Assessment:  Urothelial carcinoma of the bladder, high grade, Stage T1. Plan:  Repeat TURBT.

## 2016-05-25 NOTE — Anesthesia Procedure Notes (Signed)
Procedure Name: LMA Insertion Date/Time: 05/25/2016 1:31 PM Performed by: Wanita Chamberlain Pre-anesthesia Checklist: Patient identified, Emergency Drugs available, Suction available, Patient being monitored and Timeout performed Patient Re-evaluated:Patient Re-evaluated prior to inductionOxygen Delivery Method: Circle system utilized Preoxygenation: Pre-oxygenation with 100% oxygen Intubation Type: IV induction Ventilation: Mask ventilation without difficulty LMA: LMA inserted LMA Size: 5.0 Number of attempts: 1 Placement Confirmation: breath sounds checked- equal and bilateral and positive ETCO2 Tube secured with: Tape Dental Injury: Teeth and Oropharynx as per pre-operative assessment

## 2016-05-26 DIAGNOSIS — C679 Malignant neoplasm of bladder, unspecified: Secondary | ICD-10-CM | POA: Diagnosis not present

## 2016-05-26 NOTE — Progress Notes (Signed)
Patient very agitated and restless this am. Patient voided 25ccBladder scan revealed 117 cc in bladder at 0900. Patient voided 25 cc again at 1200 and post void residual was 177. MD was notified. MD wants to wait two hours for patient to try to void again. Will continue to monitor patient and encourage fluids.

## 2016-05-26 NOTE — Discharge Summary (Signed)
Date of admission: 05/25/2016  Date of discharge: 05/26/2016  Admission diagnosis: Bladder cancer  Discharge diagnosis: Same  History and Physical: For full details, please see admission history and physical. Briefly, Brett Santiago is a 83 y.o. male with bladder cancer. After discussing management/treatment options, he  elected to proceed with surgical treatment.  Hospital Course: Brett Santiago was taken to the operating room on 05/25/2016 and underwent a TURBT with Dr. Diona Fanti. He tolerated this procedure well and without complications. Postoperatively, the patient was able to be transferred to a regular hospital room following recovery from anesthesia.  They were able to begin ambulating the night of surgery and remained hemodynamically stable overnight.  On POD#1, his foley was removed and he was able to urinate. They was transitioned to oral pain medication, tolerated a regular diet, and had met all discharge criteria and was able to be discharged home on POD#1.  Laboratory values: No results for input(s): HGB, HCT in the last 72 hours.  Disposition: Home  Discharge instruction: They were instructed to be ambulatory but to refrain from heavy lifting, strenuous activity, or driving.  Discharge medications:   Allergies as of 05/26/2016   No Known Allergies     Medication List    TAKE these medications   donepezil 5 MG tablet Commonly known as:  ARICEPT Take 1 tablet by mouth every morning.   MULTIVITAMIN PO Take 1 tablet by mouth daily.   QUEtiapine 25 MG tablet Commonly known as:  SEROQUEL Take 25 mg by mouth at bedtime.   VITAMIN D PO Take 1 tablet by mouth daily.       Followup: He will follow-up in the near future with Dr. Diona Fanti.

## 2016-05-26 NOTE — Progress Notes (Signed)
Patient voided 275 cc in urinal without complication. Urine was tea colored and no odor noted. Bladder scan revealed 394 cc in bladder. MD was notified of situation and plans are to discharge patient at this time. Setzer, Marchelle Folks

## 2016-05-28 ENCOUNTER — Encounter (HOSPITAL_BASED_OUTPATIENT_CLINIC_OR_DEPARTMENT_OTHER): Payer: Self-pay | Admitting: Urology

## 2016-05-29 LAB — POCT I-STAT, CHEM 8
BUN: 21 mg/dL — ABNORMAL HIGH (ref 6–20)
CREATININE: 1.1 mg/dL (ref 0.61–1.24)
Calcium, Ion: 1.24 mmol/L (ref 1.15–1.40)
Chloride: 104 mmol/L (ref 101–111)
Glucose, Bld: 94 mg/dL (ref 65–99)
HEMATOCRIT: 39 % (ref 39.0–52.0)
HEMOGLOBIN: 13.3 g/dL (ref 13.0–17.0)
POTASSIUM: 4.1 mmol/L (ref 3.5–5.1)
SODIUM: 142 mmol/L (ref 135–145)
TCO2: 29 mmol/L (ref 0–100)

## 2016-05-31 NOTE — Op Note (Signed)
Preoperative diagnosis:history of bladder cancer, high-grade, status post prior TURBT  Postoperative diagnosis:same, with significant tumor within the bladder diverticulum   Procedure:TURBT of 2 cm bladder tumor within right posterior bladder diverticulum    Surgeon: Lillette Boxer. Mackinze Criado, M.D.   Anesthesia: Gen.   Complications:none  Specimen(s):bladder tumor, to pathology  Drain(s):20 French Foley catheter  Indications:83 year old male with dementia, with recent TURBT revealing high-grade but noninvasive bladder cancer.  He presents at this time for repeat TURBT.    Technique and findings:The patient was properly identified in the holding area.He received preoperative IV antibiotics.  He was taken to the operating room where general anesthetic was administered.  He was placed in the dorsolithotomy position.  Genitalia and perineum were prepped and draped.  Proper timeout was performed.  His urethra was calibrated to 30 [place] with [employer].  A 26 French resectoscope was then placed using the visual obturator.  The urethra was without lesions or stricture.  Prostate moderately obstructive.  The bladder was entered and inspected circumferentially.  No discrete urothelial lesions were noted.  Prior resection site was scarred, without any visible lesion noted.  There was no erythema around this area.  I passed the scope in one posterior right bladder diverticulum.  This was not readily evident on the prior cystoscopy.  There was a tumor within this, papillary in nature.  Using great care not to perforate, I then resected the tumor with the cutting loop.  Multiple tumor fragments were thus produced.  Resection was carried out to completion of removing all visible tumor.  The tumor bed was then cauterized.  No bleeding was seen.  The tumor fragments were then irrigated from the bladder with the [facility] syringe.  These were sent for permanent specimen.  There being no further bleeding, the  scope was removed.  A 20 French Foley catheter was then placed and hooked to dependent drainage.  The patient tolerated procedure well.  He was awakened and taken to the PACU in stable condition.

## 2016-05-31 NOTE — Anesthesia Postprocedure Evaluation (Signed)
Anesthesia Post Note  Patient: Brett Santiago  Procedure(s) Performed: Procedure(s) (LRB): REPEAT TRANSURETHRAL RESECTION OF BLADDER TUMOR (TURBT) WITH LOOP (N/A)  Patient location during evaluation: PACU Anesthesia Type: General Level of consciousness: awake Pain management: pain level controlled Vital Signs Assessment: post-procedure vital signs reviewed and stable Respiratory status: spontaneous breathing Cardiovascular status: stable Anesthetic complications: no       Last Vitals:  Vitals:   05/26/16 0148 05/26/16 0545  BP: 134/64 130/66  Pulse: 76 75  Resp: 18 18  Temp: 36.7 C 36.6 C    Last Pain:  Vitals:   05/28/16 1135  TempSrc:   PainSc: 0-No pain                 Yehudit Fulginiti

## 2016-06-18 NOTE — Anesthesia Postprocedure Evaluation (Signed)
Anesthesia Post Note  Patient: Brett Santiago  Procedure(s) Performed: Procedure(s) (LRB): TRANSURETHRAL RESECTION OF BLADDER TUMOR (TURBT) AND EPIRUBICIN INSTILLATION (N/A)     Anesthesia Post Evaluation  Last Vitals:  Vitals:   03/09/16 2054 03/10/16 0525  BP: 130/68 (!) 116/58  Pulse: 71 73  Resp: 18 19  Temp: 37.2 C 36.8 C    Last Pain:  Vitals:   03/12/16 1232  TempSrc:   PainSc: 0-No pain                 Jaretzi Droz S

## 2016-06-18 NOTE — Addendum Note (Signed)
Addendum  created 06/18/16 1130 by Airyana Sprunger, MD   Sign clinical note    

## 2016-12-02 ENCOUNTER — Emergency Department (HOSPITAL_COMMUNITY)
Admission: EM | Admit: 2016-12-02 | Discharge: 2016-12-04 | Disposition: A | Discharge status: Skilled Nursing Facility | Payer: Medicare Other | Attending: Emergency Medicine | Admitting: Emergency Medicine

## 2016-12-02 ENCOUNTER — Encounter (HOSPITAL_COMMUNITY): Payer: Self-pay

## 2016-12-02 ENCOUNTER — Emergency Department (HOSPITAL_COMMUNITY): Payer: Medicare Other

## 2016-12-02 ENCOUNTER — Other Ambulatory Visit: Payer: Self-pay

## 2016-12-02 DIAGNOSIS — Z79899 Other long term (current) drug therapy: Secondary | ICD-10-CM | POA: Insufficient documentation

## 2016-12-02 DIAGNOSIS — F03918 Unspecified dementia, unspecified severity, with other behavioral disturbance: Secondary | ICD-10-CM | POA: Diagnosis present

## 2016-12-02 DIAGNOSIS — Z046 Encounter for general psychiatric examination, requested by authority: Secondary | ICD-10-CM | POA: Diagnosis present

## 2016-12-02 DIAGNOSIS — N3001 Acute cystitis with hematuria: Secondary | ICD-10-CM | POA: Diagnosis not present

## 2016-12-02 DIAGNOSIS — Z8551 Personal history of malignant neoplasm of bladder: Secondary | ICD-10-CM | POA: Diagnosis not present

## 2016-12-02 DIAGNOSIS — Z87891 Personal history of nicotine dependence: Secondary | ICD-10-CM | POA: Diagnosis not present

## 2016-12-02 DIAGNOSIS — F0150 Vascular dementia without behavioral disturbance: Secondary | ICD-10-CM | POA: Diagnosis present

## 2016-12-02 DIAGNOSIS — Z85038 Personal history of other malignant neoplasm of large intestine: Secondary | ICD-10-CM | POA: Insufficient documentation

## 2016-12-02 DIAGNOSIS — F0391 Unspecified dementia with behavioral disturbance: Secondary | ICD-10-CM | POA: Insufficient documentation

## 2016-12-02 DIAGNOSIS — R4587 Impulsiveness: Secondary | ICD-10-CM | POA: Diagnosis not present

## 2016-12-02 LAB — CBC
HCT: 35.3 % — ABNORMAL LOW (ref 39.0–52.0)
Hemoglobin: 11.3 g/dL — ABNORMAL LOW (ref 13.0–17.0)
MCH: 28.3 pg (ref 26.0–34.0)
MCHC: 32 g/dL (ref 30.0–36.0)
MCV: 88.3 fL (ref 78.0–100.0)
PLATELETS: 292 10*3/uL (ref 150–400)
RBC: 4 MIL/uL — ABNORMAL LOW (ref 4.22–5.81)
RDW: 13.3 % (ref 11.5–15.5)
WBC: 9.3 10*3/uL (ref 4.0–10.5)

## 2016-12-02 LAB — COMPREHENSIVE METABOLIC PANEL
ALBUMIN: 3 g/dL — AB (ref 3.5–5.0)
ALT: 40 U/L (ref 17–63)
ANION GAP: 9 (ref 5–15)
AST: 37 U/L (ref 15–41)
Alkaline Phosphatase: 146 U/L — ABNORMAL HIGH (ref 38–126)
BILIRUBIN TOTAL: 0.5 mg/dL (ref 0.3–1.2)
BUN: 41 mg/dL — ABNORMAL HIGH (ref 6–20)
CO2: 30 mmol/L (ref 22–32)
Calcium: 8.9 mg/dL (ref 8.9–10.3)
Chloride: 102 mmol/L (ref 101–111)
Creatinine, Ser: 1.29 mg/dL — ABNORMAL HIGH (ref 0.61–1.24)
GFR calc Af Amer: 57 mL/min — ABNORMAL LOW (ref >60)
GFR calc non Af Amer: 49 mL/min — ABNORMAL LOW (ref >60)
GLUCOSE: 136 mg/dL — AB (ref 65–99)
POTASSIUM: 3.5 mmol/L (ref 3.5–5.1)
Sodium: 141 mmol/L (ref 135–145)
TOTAL PROTEIN: 6.7 g/dL (ref 6.5–8.1)

## 2016-12-02 LAB — RAPID URINE DRUG SCREEN, HOSP PERFORMED
Amphetamines: NOT DETECTED
BENZODIAZEPINES: NOT DETECTED
Barbiturates: NOT DETECTED
COCAINE: NOT DETECTED
OPIATES: NOT DETECTED
Tetrahydrocannabinol: NOT DETECTED

## 2016-12-02 LAB — URINALYSIS, ROUTINE W REFLEX MICROSCOPIC
BILIRUBIN URINE: NEGATIVE
GLUCOSE, UA: NEGATIVE mg/dL
Ketones, ur: NEGATIVE mg/dL
NITRITE: NEGATIVE
PH: 6 (ref 5.0–8.0)
Protein, ur: 30 mg/dL — AB
SPECIFIC GRAVITY, URINE: 1.016 (ref 1.005–1.030)

## 2016-12-02 LAB — ETHANOL

## 2016-12-02 LAB — ACETAMINOPHEN LEVEL

## 2016-12-02 LAB — SALICYLATE LEVEL

## 2016-12-02 MED ORDER — HALOPERIDOL LACTATE 5 MG/ML IJ SOLN
5.0000 mg | Freq: Once | INTRAMUSCULAR | Status: AC
  Administered 2016-12-02: 5 mg via INTRAMUSCULAR

## 2016-12-02 MED ORDER — HALOPERIDOL LACTATE 5 MG/ML IJ SOLN
2.0000 mg | Freq: Four times a day (QID) | INTRAMUSCULAR | Status: DC | PRN
  Filled 2016-12-02: qty 1

## 2016-12-02 MED ORDER — QUETIAPINE FUMARATE 25 MG PO TABS
25.0000 mg | ORAL_TABLET | Freq: Every day | ORAL | Status: DC
Start: 2016-12-02 — End: 2016-12-04
  Administered 2016-12-02 – 2016-12-03 (×2): 25 mg via ORAL
  Filled 2016-12-02 (×2): qty 1

## 2016-12-02 MED ORDER — HALOPERIDOL LACTATE 5 MG/ML IJ SOLN
5.0000 mg | Freq: Once | INTRAMUSCULAR | Status: AC
  Administered 2016-12-02: 5 mg via INTRAMUSCULAR
  Filled 2016-12-02: qty 1

## 2016-12-02 MED ORDER — DONEPEZIL HCL 5 MG PO TABS
5.0000 mg | ORAL_TABLET | Freq: Every morning | ORAL | Status: DC
  Administered 2016-12-03 – 2016-12-04 (×2): 5 mg via ORAL
  Filled 2016-12-02 (×2): qty 1

## 2016-12-02 MED ORDER — CEPHALEXIN 500 MG PO CAPS
500.0000 mg | ORAL_CAPSULE | Freq: Four times a day (QID) | ORAL | Status: DC
  Administered 2016-12-02 – 2016-12-04 (×7): 500 mg via ORAL
  Filled 2016-12-02 (×8): qty 1

## 2016-12-02 NOTE — Progress Notes (Signed)
IVC has still not been received by [facility]. TTS unable to complete assessment until the IVC has been reviewed.  Lind Covert, MSW, LCSW Therapeutic Triage Specialist  [phone]

## 2016-12-02 NOTE — BH Assessment (Signed)
[facility] Assessment Progress Note   Per Lindon Romp, NP pt does not meet criteria for inpt treatment. Request a social work consult be placed by the EDP due to the pt's worsening Alzheimer's and the need for a higher level of care outside of the home. EDP Caccavale, Sophia, PA-C made aware of TTS recommendation.  Lind Covert, MSW, LCSW Therapeutic Triage Specialist  [phone]
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
## 2016-12-02 NOTE — Progress Notes (Signed)
TTS contacted the pt's nurse Rip Harbour, RN to set up TTS cart for the assessment. Requested IVC papers faxed to [facility] at [phone] for review.   Lind Covert, MSW, LCSW Therapeutic Triage Specialist  [phone]
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
## 2016-12-02 NOTE — ED Notes (Signed)
Patient transported to CT 
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
## 2016-12-02 NOTE — ED Notes (Signed)
Pt given meal tray.

## 2016-12-03 DIAGNOSIS — Z87891 Personal history of nicotine dependence: Secondary | ICD-10-CM

## 2016-12-03 DIAGNOSIS — R451 Restlessness and agitation: Secondary | ICD-10-CM

## 2016-12-03 DIAGNOSIS — F0391 Unspecified dementia with behavioral disturbance: Secondary | ICD-10-CM | POA: Diagnosis not present

## 2016-12-03 DIAGNOSIS — F0150 Vascular dementia without behavioral disturbance: Secondary | ICD-10-CM

## 2016-12-03 DIAGNOSIS — R4587 Impulsiveness: Secondary | ICD-10-CM | POA: Diagnosis not present

## 2016-12-03 DIAGNOSIS — R413 Other amnesia: Secondary | ICD-10-CM

## 2016-12-03 DIAGNOSIS — F03918 Unspecified dementia, unspecified severity, with other behavioral disturbance: Secondary | ICD-10-CM | POA: Diagnosis present

## 2016-12-03 MED ORDER — HALOPERIDOL LACTATE 5 MG/ML IJ SOLN
5.0000 mg | Freq: Once | INTRAMUSCULAR | Status: AC
  Administered 2016-12-03: 5 mg via INTRAMUSCULAR
  Filled 2016-12-03: qty 1

## 2016-12-03 MED ORDER — CITALOPRAM HYDROBROMIDE 10 MG PO TABS
10.0000 mg | ORAL_TABLET | Freq: Every day | ORAL | Status: DC
  Administered 2016-12-03 – 2016-12-04 (×2): 10 mg via ORAL
  Filled 2016-12-03 (×2): qty 1

## 2016-12-03 NOTE — Progress Notes (Signed)
CSW received a call from [facility] has been accepted by: [facility] Number for report is: [phone] Pt's unit/room/bed number will be: Room 209 Accepting physician: SNF MD  Pt can arrive ASAP on Saturday morning on 11/20  CSW will leave handoff for AM CSW on 11/20 and also for the 11/19 RN.  Alphonse Guild. Axzel Rockhill, LCSW, LCAS, CSI Clinical Social Worker Ph: [phone]

## 2016-12-03 NOTE — ED Provider Notes (Signed)
Notified that pt has been more agitated.  Attempting to walk out of the ED.  Will try a dose of haldol.  He was given this previously.   Dorie Rank, MD 12/03/16 2242

## 2016-12-03 NOTE — Progress Notes (Addendum)
3:45PM: Patient has been accepted to [facility] for 11/20- CSW is awaiting to hear back from patients family to determine if they are agreeable to this placement. CSW will facilitate discharge to [facility] if family is agreeable on 11/20.   11:15AM: CSW spoke with patients daughter, Helene Kelp [phone], who stated that patient has had increased aggression over the past few months. Patients daughter and spouse have been looking into [facility] however have not made any plans for patient to be admitted to facility. Spouse does not fell safe with patient returning home. CSW has reached out to [facility], [place], [facility], and [facility]. At this time, CSW has heard back from [facility] and family is willing to tour facility. Will continue to update.   CSW aware of consult and following up at this time. Barriers to placement include: patient has Alzheimer's disease with behavioral disturbance's and would need locked unit- would not be appropriate for skilled nursing due to behaviors and potential wondering. CSW will disucss finances with spouse to determine placement options.    Kingsley Spittle, [facility] Emergency [facility] Clinical Social Worker [phone]

## 2016-12-03 NOTE — Progress Notes (Addendum)
CSW received a call from [place] at [facility] asking if the pt is needing placement.  CSW informed Dorian Pod at [phone] pt has chosen [facility] but CSW will let Elen know if something changes.  5:21 PM CSW received a call from [facility] asking if pt needed placement, CSW stated pt has been accepted to a facility already (pt has been accepted to [facility]).  Please reconsult if future social work needs arise.  CSW signing off, as social work intervention is no longer needed.  Alphonse Guild. Demitrious Mccannon, LCSW, LCAS, CSI Clinical Social Worker Ph: [phone]

## 2016-12-03 NOTE — Discharge Planning (Signed)
[facility] notes consult for SNF placement.  Will discuss case with EDSW to allow for safe discharge from ED today.

## 2016-12-03 NOTE — ED Notes (Signed)
Brett Santiago- [phone]

## 2016-12-03 NOTE — Progress Notes (Signed)
PT Note:   Spoke with Erin, SW, will hold assessment at this time. She will call me today when pt ready or needing the PT assessment.  Thank you,  Laurette Schimke, [place] Pager: [phone] 12/03/2016

## 2016-12-03 NOTE — NC FL2 (Signed)
  [place] LEVEL OF CARE SCREENING TOOL     IDENTIFICATION  Patient Name: SALAR MOLDEN Birthdate: 1929/01/20 Sex: male Admission Date (Current Location): 12/02/2016  [facility] and [place] Number:  Herbalist and Address:  [facility],  [place] [address] [place]      Provider Number: [id number]  Attending Physician Name and Address:  No att. providers found  Relative Name and Phone Number:       Current Level of Care: Hospital Recommended Level of Care: [place] Prior Approval Number:    Date Approved/Denied:   PASRR Number:   [id number]   Discharge Plan: SNF    Current Diagnoses: Patient Active Problem List   Diagnosis Date Noted  . Bladder neoplasm 05/25/2016  . 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 Pressure injury of skin 02/17/2016  . Weakness generalized 02/16/2016  . Vascular dementia without behavioral disturbance   . CAP (community acquired pneumonia) 02/15/2016  . COLON CANCER 04/29/2007  . COLONIC POLYPS 04/29/2007    Orientation RESPIRATION BLADDER Height & Weight        Normal Continent Weight:  157 Height:     BEHAVIORAL SYMPTOMS/MOOD NEUROLOGICAL BOWEL NUTRITION STATUS  Clinical research associate)  AMBULATORY STATUS COMMUNICATION OF NEEDS Skin   Limited Assist Verbally Normal                       Personal Care Assistance Level of Assistance  Bathing, Feeding, Dressing Bathing Assistance: Limited assistance Feeding assistance: Independent Dressing Assistance: Limited assistance     Functional Limitations Info             SPECIAL CARE FACTORS FREQUENCY                       Contractures      Additional Factors Info  Code Status, Allergies Code Status Info: full code  Allergies Info: NKA            Current Medications (12/03/2016):  This is the current hospital active medication list Current Facility-Administered Medications   Medication Dose Route Frequency Provider Last Rate Last Dose  . cephALEXin (KEFLEX) capsule 500 mg  500 mg Oral Q6H Caccavale, Sophia, PA-C   500 mg at 12/03/16 8338  . donepezil (ARICEPT) tablet 5 mg  5 mg Oral q morning - 10a Caccavale, Sophia, PA-C   5 mg at 12/03/16 0820  . QUEtiapine (SEROQUEL) tablet 25 mg  25 mg Oral QHS Caccavale, Sophia, PA-C   25 mg at 12/02/16 2230   Current Outpatient Medications  Medication Sig Dispense Refill  . Cholecalciferol (VITAMIN D PO) Take 1 tablet by mouth daily.    Marland Kitchen donepezil (ARICEPT) 5 MG tablet Take 1 tablet by mouth every morning.     . Multiple Vitamins-Minerals (MULTIVITAMIN PO) Take 1 tablet by mouth daily.    . QUEtiapine (SEROQUEL) 25 MG tablet Take 25 mg by mouth at bedtime.     Facility-Administered Medications Ordered in Other Encounters  Medication Dose Route Frequency Provider Last Rate Last Dose  . epirubicin (ELLENCE) 50 mg in sodium chloride 0.9 % bladder instillation  50 mg Bladder Instillation Once Franchot Gallo, MD         Discharge Medications: Please see discharge summary for a list of discharge medications.  Relevant Imaging Results:  Relevant Lab Results:   Additional Information  [id number]  Weston Anna, LCSW
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
## 2016-12-04 LAB — URINE CULTURE

## 2016-12-04 MED ORDER — CITALOPRAM HYDROBROMIDE 10 MG PO TABS: 10.0000 mg | ORAL_TABLET | Freq: Every day | ORAL | 0 refills | Status: DC

## 2016-12-04 MED ORDER — CEPHALEXIN 500 MG PO CAPS: 500.0000 mg | ORAL_CAPSULE | Freq: Four times a day (QID) | ORAL | 0 refills | Status: AC

## 2016-12-04 NOTE — ED Notes (Signed)
Pt ambulated to the bathroom with intent to urinate but was not able. Brief was dry at this time.

## 2016-12-04 NOTE — Progress Notes (Signed)
CSW following for disposition to [facility] today. Family is completing admission paperwork at facility today and Admissions will notify CSW when complete.   Report # will be [phone], [facility] room 209. CSW will notify RN when pt can be transported.   Provided AVS to facility in order to have pt's medications and coordinate care.  Sharren Bridge, MSW, LCSW Clinical Social Work 12/04/2016 Coverage for [phone]

## 2016-12-04 NOTE — Progress Notes (Signed)
CSW contacted [facility] for transportation to [facility]. CSW spoke with patients daughter, Helene Kelp [phone], and informed family patient is discharging from ED 11/20. Family aware and to meet patient at [facility].   Kingsley Spittle, [facility] Clinical Social Worker [phone]

## 2016-12-04 NOTE — Progress Notes (Signed)
PT Cancellation Note  Patient Details Name: VILAS EDGERLY MRN: [id number] DOB: August 24, 1929   Cancelled Treatment:    Reason Eval/Treat Not Completed: PT screened, no needs identified, will sign off , noted  That patient transferring to SNF today.   Claretha Cooper 12/04/2016, 8:56 AM  Tresa Endo PT [phone]
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
## 2016-12-04 NOTE — Progress Notes (Signed)
Patient ID: Brett Santiago, male   DOB: 1929/03/09, 83 y.o.   MRN: [id number]  Pt has been cleared psychiatrically and is stable for discharge and transport to SNF.  Ethelene Hal, NP-C 12/04/2016     1332

## 2016-12-04 NOTE — ED Notes (Signed)
Pt. Vitals was taken. Pt brief was changed by Probation officer and Nurse Kiristin.

## 2016-12-04 NOTE — Progress Notes (Signed)
Noted pt going to SNF this day- will defer OT eval to SNF [place], [place]

## 2016-12-04 NOTE — ED Notes (Signed)
Pt had incontinent episode. Writer and NT Sammie Bench got patients vitals and provide care to get Pt cleaned up with new brief and new scrubs.

## 2017-01-18 ENCOUNTER — Emergency Department (HOSPITAL_COMMUNITY): Payer: Medicare Other

## 2017-01-18 ENCOUNTER — Encounter (HOSPITAL_COMMUNITY): Payer: Self-pay

## 2017-01-18 ENCOUNTER — Inpatient Hospital Stay (HOSPITAL_COMMUNITY)
Admission: EM | Admit: 2017-01-18 | Discharge: 2017-01-23 | DRG: 682 | Disposition: A | Discharge status: Hospice | Payer: Medicare Other | Attending: Family Medicine | Admitting: Family Medicine

## 2017-01-18 ENCOUNTER — Other Ambulatory Visit: Payer: Self-pay

## 2017-01-18 DIAGNOSIS — N400 Enlarged prostate without lower urinary tract symptoms: Secondary | ICD-10-CM | POA: Diagnosis present

## 2017-01-18 DIAGNOSIS — N3001 Acute cystitis with hematuria: Secondary | ICD-10-CM | POA: Diagnosis present

## 2017-01-18 DIAGNOSIS — M810 Age-related osteoporosis without current pathological fracture: Secondary | ICD-10-CM | POA: Diagnosis present

## 2017-01-18 DIAGNOSIS — N179 Acute kidney failure, unspecified: Principal | ICD-10-CM | POA: Diagnosis present

## 2017-01-18 DIAGNOSIS — Z87891 Personal history of nicotine dependence: Secondary | ICD-10-CM | POA: Diagnosis not present

## 2017-01-18 DIAGNOSIS — M6282 Rhabdomyolysis: Secondary | ICD-10-CM | POA: Diagnosis present

## 2017-01-18 DIAGNOSIS — C679 Malignant neoplasm of bladder, unspecified: Secondary | ICD-10-CM | POA: Diagnosis present

## 2017-01-18 DIAGNOSIS — D494 Neoplasm of unspecified behavior of bladder: Secondary | ICD-10-CM | POA: Diagnosis present

## 2017-01-18 DIAGNOSIS — B9620 Unspecified Escherichia coli [E. coli] as the cause of diseases classified elsewhere: Secondary | ICD-10-CM | POA: Diagnosis present

## 2017-01-18 DIAGNOSIS — I10 Essential (primary) hypertension: Secondary | ICD-10-CM | POA: Diagnosis present

## 2017-01-18 DIAGNOSIS — F0150 Vascular dementia without behavioral disturbance: Secondary | ICD-10-CM | POA: Diagnosis present

## 2017-01-18 DIAGNOSIS — E860 Dehydration: Secondary | ICD-10-CM | POA: Diagnosis not present

## 2017-01-18 DIAGNOSIS — Z66 Do not resuscitate: Secondary | ICD-10-CM | POA: Diagnosis present

## 2017-01-18 DIAGNOSIS — G9341 Metabolic encephalopathy: Secondary | ICD-10-CM | POA: Diagnosis present

## 2017-01-18 DIAGNOSIS — E875 Hyperkalemia: Secondary | ICD-10-CM | POA: Diagnosis present

## 2017-01-18 DIAGNOSIS — M25551 Pain in right hip: Secondary | ICD-10-CM | POA: Diagnosis present

## 2017-01-18 DIAGNOSIS — R627 Adult failure to thrive: Secondary | ICD-10-CM | POA: Diagnosis present

## 2017-01-18 DIAGNOSIS — Z681 Body mass index (BMI) 19 or less, adult: Secondary | ICD-10-CM

## 2017-01-18 DIAGNOSIS — Z973 Presence of spectacles and contact lenses: Secondary | ICD-10-CM

## 2017-01-18 DIAGNOSIS — C7951 Secondary malignant neoplasm of bone: Secondary | ICD-10-CM | POA: Diagnosis present

## 2017-01-18 DIAGNOSIS — Z515 Encounter for palliative care: Secondary | ICD-10-CM | POA: Diagnosis not present

## 2017-01-18 DIAGNOSIS — F03918 Unspecified dementia, unspecified severity, with other behavioral disturbance: Secondary | ICD-10-CM | POA: Diagnosis present

## 2017-01-18 DIAGNOSIS — F0151 Vascular dementia with behavioral disturbance: Secondary | ICD-10-CM | POA: Diagnosis present

## 2017-01-18 DIAGNOSIS — R531 Weakness: Secondary | ICD-10-CM

## 2017-01-18 DIAGNOSIS — I700 Atherosclerosis of aorta: Secondary | ICD-10-CM | POA: Diagnosis present

## 2017-01-18 DIAGNOSIS — Z79899 Other long term (current) drug therapy: Secondary | ICD-10-CM

## 2017-01-18 DIAGNOSIS — Z972 Presence of dental prosthetic device (complete) (partial): Secondary | ICD-10-CM | POA: Diagnosis not present

## 2017-01-18 DIAGNOSIS — W19XXXA Unspecified fall, initial encounter: Secondary | ICD-10-CM | POA: Diagnosis present

## 2017-01-18 DIAGNOSIS — Z85038 Personal history of other malignant neoplasm of large intestine: Secondary | ICD-10-CM | POA: Diagnosis not present

## 2017-01-18 DIAGNOSIS — F05 Delirium due to known physiological condition: Secondary | ICD-10-CM | POA: Diagnosis present

## 2017-01-18 DIAGNOSIS — Z7189 Other specified counseling: Secondary | ICD-10-CM | POA: Diagnosis not present

## 2017-01-18 DIAGNOSIS — C799 Secondary malignant neoplasm of unspecified site: Secondary | ICD-10-CM

## 2017-01-18 DIAGNOSIS — F0391 Unspecified dementia with behavioral disturbance: Secondary | ICD-10-CM | POA: Diagnosis present

## 2017-01-18 DIAGNOSIS — R4182 Altered mental status, unspecified: Secondary | ICD-10-CM | POA: Diagnosis present

## 2017-01-18 DIAGNOSIS — N390 Urinary tract infection, site not specified: Secondary | ICD-10-CM

## 2017-01-18 DIAGNOSIS — E870 Hyperosmolality and hypernatremia: Secondary | ICD-10-CM | POA: Diagnosis present

## 2017-01-18 DIAGNOSIS — Z86010 Personal history of colonic polyps: Secondary | ICD-10-CM

## 2017-01-18 LAB — CBC WITH DIFFERENTIAL/PLATELET
BASOS ABS: 0 10*3/uL (ref 0.0–0.1)
Basophils Relative: 0 %
Eosinophils Absolute: 0 10*3/uL (ref 0.0–0.7)
Eosinophils Relative: 0 %
HCT: 39.3 % (ref 39.0–52.0)
HEMOGLOBIN: 12.4 g/dL — AB (ref 13.0–17.0)
LYMPHS ABS: 1.3 10*3/uL (ref 0.7–4.0)
LYMPHS PCT: 8 %
MCH: 26.3 pg (ref 26.0–34.0)
MCHC: 31.6 g/dL (ref 30.0–36.0)
MCV: 83.3 fL (ref 78.0–100.0)
Monocytes Absolute: 1.1 10*3/uL — ABNORMAL HIGH (ref 0.1–1.0)
Monocytes Relative: 7 %
Neutro Abs: 13.8 10*3/uL — ABNORMAL HIGH (ref 1.7–7.7)
Neutrophils Relative %: 85 %
Platelets: 253 10*3/uL (ref 150–400)
RBC: 4.72 MIL/uL (ref 4.22–5.81)
RDW: 14.6 % (ref 11.5–15.5)
WBC: 16.2 10*3/uL — AB (ref 4.0–10.5)

## 2017-01-18 LAB — MRSA PCR SCREENING: MRSA BY PCR: NEGATIVE

## 2017-01-18 LAB — URINALYSIS, ROUTINE W REFLEX MICROSCOPIC
Bilirubin Urine: NEGATIVE
GLUCOSE, UA: NEGATIVE mg/dL
KETONES UR: NEGATIVE mg/dL
NITRITE: NEGATIVE
PROTEIN: 100 mg/dL — AB
Specific Gravity, Urine: 1.017 (ref 1.005–1.030)
pH: 6 (ref 5.0–8.0)

## 2017-01-18 LAB — COMPREHENSIVE METABOLIC PANEL
ALBUMIN: 2.8 g/dL — AB (ref 3.5–5.0)
ALT: 14 U/L — ABNORMAL LOW (ref 17–63)
AST: 38 U/L (ref 15–41)
Alkaline Phosphatase: 115 U/L (ref 38–126)
Anion gap: 8 (ref 5–15)
BILIRUBIN TOTAL: 1.2 mg/dL (ref 0.3–1.2)
BUN: 58 mg/dL — ABNORMAL HIGH (ref 6–20)
CHLORIDE: 108 mmol/L (ref 101–111)
CO2: 28 mmol/L (ref 22–32)
Calcium: 8.7 mg/dL — ABNORMAL LOW (ref 8.9–10.3)
Creatinine, Ser: 2.2 mg/dL — ABNORMAL HIGH (ref 0.61–1.24)
GFR calc Af Amer: 30 mL/min — ABNORMAL LOW (ref >60)
GFR, EST NON AFRICAN AMERICAN: 26 mL/min — AB (ref >60)
Glucose, Bld: 99 mg/dL (ref 65–99)
POTASSIUM: 5.3 mmol/L — AB (ref 3.5–5.1)
Sodium: 144 mmol/L (ref 135–145)
TOTAL PROTEIN: 6.6 g/dL (ref 6.5–8.1)

## 2017-01-18 LAB — I-STAT CG4 LACTIC ACID, ED: Lactic Acid, Venous: 1.56 mmol/L (ref 0.5–1.9)

## 2017-01-18 LAB — CK: Total CK: 458 U/L — ABNORMAL HIGH (ref 49–397)

## 2017-01-18 MED ORDER — TRAMADOL HCL 50 MG PO TABS: 50.0000 mg | ORAL_TABLET | Freq: Four times a day (QID) | ORAL | Status: DC | PRN

## 2017-01-18 MED ORDER — CEFTRIAXONE SODIUM 1 G IJ SOLR
1.0000 g | Freq: Once | INTRAMUSCULAR | Status: AC
  Administered 2017-01-18: 1 g via INTRAVENOUS
  Filled 2017-01-18: qty 10

## 2017-01-18 MED ORDER — DONEPEZIL HCL 10 MG PO TABS
5.0000 mg | ORAL_TABLET | Freq: Every day | ORAL | Status: DC
  Administered 2017-01-18 – 2017-01-21 (×4): 5 mg via ORAL
  Filled 2017-01-18 (×4): qty 1

## 2017-01-18 MED ORDER — CITALOPRAM HYDROBROMIDE 20 MG PO TABS
10.0000 mg | ORAL_TABLET | Freq: Every day | ORAL | Status: DC
  Administered 2017-01-18 – 2017-01-20 (×3): 10 mg via ORAL
  Filled 2017-01-18 (×3): qty 1

## 2017-01-18 MED ORDER — MORPHINE SULFATE (PF) 4 MG/ML IV SOLN
1.0000 mg | INTRAVENOUS | Status: DC | PRN
  Administered 2017-01-19 – 2017-01-20 (×4): 1 mg via INTRAVENOUS
  Filled 2017-01-18 (×4): qty 1

## 2017-01-18 MED ORDER — QUETIAPINE FUMARATE 25 MG PO TABS
25.0000 mg | ORAL_TABLET | Freq: Every day | ORAL | Status: DC
  Administered 2017-01-18 – 2017-01-21 (×4): 25 mg via ORAL
  Filled 2017-01-18 (×4): qty 1

## 2017-01-18 MED ORDER — ENOXAPARIN SODIUM 30 MG/0.3ML ~~LOC~~ SOLN
30.0000 mg | SUBCUTANEOUS | Status: DC
  Administered 2017-01-18 – 2017-01-19 (×2): 30 mg via SUBCUTANEOUS
  Filled 2017-01-18 (×2): qty 0.3

## 2017-01-18 MED ORDER — SODIUM CHLORIDE 0.9 % IV SOLN
INTRAVENOUS | Status: DC
  Administered 2017-01-19 – 2017-01-20 (×3): via INTRAVENOUS

## 2017-01-18 MED ORDER — ONDANSETRON HCL 4 MG/2ML IJ SOLN: 4.0000 mg | Freq: Four times a day (QID) | INTRAMUSCULAR | Status: DC | PRN

## 2017-01-18 MED ORDER — SODIUM CHLORIDE 0.9 % IV BOLUS (SEPSIS)
1000.0000 mL | Freq: Once | INTRAVENOUS | Status: AC
  Administered 2017-01-18: 1000 mL via INTRAVENOUS

## 2017-01-18 MED ORDER — DIVALPROEX SODIUM 125 MG PO CSDR
250.0000 mg | DELAYED_RELEASE_CAPSULE | Freq: Every day | ORAL | Status: DC
  Administered 2017-01-18 – 2017-01-21 (×4): 250 mg via ORAL
  Filled 2017-01-18 (×4): qty 2

## 2017-01-18 MED ORDER — ONDANSETRON HCL 4 MG PO TABS: 4.0000 mg | ORAL_TABLET | Freq: Four times a day (QID) | ORAL | Status: DC | PRN

## 2017-01-18 MED ORDER — SODIUM CHLORIDE 0.9 % IV BOLUS (SEPSIS)
500.0000 mL | Freq: Once | INTRAVENOUS | Status: AC
  Administered 2017-01-18: 500 mL via INTRAVENOUS

## 2017-01-18 MED ORDER — SODIUM CHLORIDE 0.9 % IV SOLN: 1000.0000 mL | INTRAVENOUS | Status: DC

## 2017-01-18 MED ORDER — DEXTROSE 5 % IV SOLN
1.0000 g | INTRAVENOUS | Status: DC
  Administered 2017-01-19 – 2017-01-21 (×3): 1 g via INTRAVENOUS
  Filled 2017-01-18 (×3): qty 10

## 2017-01-18 MED ORDER — TRAMADOL HCL 50 MG PO TABS: 50.0000 mg | ORAL_TABLET | Freq: Two times a day (BID) | ORAL | Status: DC | PRN

## 2017-01-18 MED ORDER — MIRTAZAPINE 15 MG PO TABS
15.0000 mg | ORAL_TABLET | Freq: Every day | ORAL | Status: DC
  Administered 2017-01-18 – 2017-01-21 (×4): 15 mg via ORAL
  Filled 2017-01-18 (×4): qty 1

## 2017-01-18 MED ORDER — HALOPERIDOL 0.5 MG PO TABS
0.5000 mg | ORAL_TABLET | ORAL | Status: DC | PRN
  Filled 2017-01-18: qty 1

## 2017-01-18 NOTE — ED Notes (Signed)
ED TO INPATIENT HANDOFF REPORT  Name/Age/Gender Brett Santiago 84 y.o. male  Code Status Code Status History    Date Active Date Inactive Code Status Order ID Comments User Context   12/02/2016 19:21 12/04/2016 16:56 Full Code [id number]  Franchot Heidelberg, PA-C ED   05/25/2016 16:01 05/26/2016 17:58 Full Code [id number]  Franchot Gallo, MD Inpatient   03/09/2016 16:02 03/10/2016 15:08 Full Code [id number]  Franchot Gallo, MD Inpatient   02/16/2016 02:53 02/17/2016 16:03 Full Code [id number]  Rise Patience, MD Inpatient      Home/SNF/Other Nursing Home  Chief Complaint fall   Level of Care/Admitting Diagnosis ED Disposition    ED Disposition Condition [facility] Area: [facility] [[id number]]  Level of Care: Telemetry [5]  Admit to tele based on following criteria: Other see comments  Comments: unwitnessed fall  Diagnosis: Altered mental status [780.97.ICD-9-CM]  Admitting Physician: Maryruth Hancock  Attending Physician: Theodis Blaze [phone]  Estimated length of stay: 3 - 4 days  Certification:: I certify this patient will need inpatient services for at least 2 midnights  PT Class (Do Not Modify): Inpatient [101]  PT Acc Code (Do Not Modify): Private [1]       Medical History Past Medical History:  Diagnosis Date  . Anemia   . Bladder cancer ([place])    UROLOGIST-  DAHLSTEDT  . BPH with elevated PSA   . Full dentures   . 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 History of solitary pulmonary nodule    02-18-2007  s/p  R VATS w/ resection right lung nodule and lymph bx's--- per pathology Hamartoma and negative nodes  . Osteoporosis   . Sundowning   . Vascular dementia without behavioral disturbance   . Wears glasses     Allergies No Known Allergies  IV Location/Drains/Wounds Patient  Lines/Drains/Airways Status   Active Line/Drains/Airways    Name:   Placement date:   Placement time:   Site:   Days:   Peripheral IV 01/18/17 Right Forearm   01/18/17    1139    Forearm   less than 1   Incision (Closed) 03/09/16 Penis Other (Comment)   03/09/16    1326     315   Incision (Closed) 05/25/16 Penis Other (Comment)   05/25/16    1343     238          Labs/Imaging Results for orders placed or performed during the hospital encounter of 01/18/17 (from the past 48 hour(s))  Comprehensive metabolic panel     Status: Abnormal   Collection Time: 01/18/17 11:00 AM  Result Value Ref Range   Sodium 144 135 - 145 mmol/L   Potassium 5.3 (H) 3.5 - 5.1 mmol/L   Chloride 108 101 - 111 mmol/L   CO2 28 22 - 32 mmol/L   Glucose, Bld 99 65 - 99 mg/dL   BUN 58 (H) 6 - 20 mg/dL   Creatinine, Ser 2.20 (H) 0.61 - 1.24 mg/dL   Calcium 8.7 (L) 8.9 - 10.3 mg/dL   Total Protein 6.6 6.5 - 8.1 g/dL   Albumin 2.8 (L) 3.5 - 5.0 g/dL   AST 38 15 - 41 U/L   ALT 14 (L) 17 - 63 U/L   Alkaline Phosphatase 115 38 - 126 U/L   Total Bilirubin 1.2 0.3 -  1.2 mg/dL   GFR calc non Af Amer 26 (L) >60 mL/min   GFR calc Af Amer 30 (L) >60 mL/min    Comment: (NOTE) The eGFR has been calculated using the CKD EPI equation. This calculation has not been validated in all clinical situations. eGFR's persistently <60 mL/min signify possible Chronic Kidney Disease.    Anion gap 8 5 - 15  CBC with Differential     Status: Abnormal   Collection Time: 01/18/17 11:00 AM  Result Value Ref Range   WBC 16.2 (H) 4.0 - 10.5 K/uL   RBC 4.72 4.22 - 5.81 MIL/uL   Hemoglobin 12.4 (L) 13.0 - 17.0 g/dL   HCT 39.3 39.0 - 52.0 %   MCV 83.3 78.0 - 100.0 fL   MCH 26.3 26.0 - 34.0 pg   MCHC 31.6 30.0 - 36.0 g/dL   RDW 14.6 11.5 - 15.5 %   Platelets 253 150 - 400 K/uL   Neutrophils Relative % 85 %   Neutro Abs 13.8 (H) 1.7 - 7.7 K/uL   Lymphocytes Relative 8 %   Lymphs Abs 1.3 0.7 - 4.0 K/uL   Monocytes Relative 7 %    Monocytes Absolute 1.1 (H) 0.1 - 1.0 K/uL   Eosinophils Relative 0 %   Eosinophils Absolute 0.0 0.0 - 0.7 K/uL   Basophils Relative 0 %   Basophils Absolute 0.0 0.0 - 0.1 K/uL  CK     Status: Abnormal   Collection Time: 01/18/17 11:00 AM  Result Value Ref Range   Total CK 458 (H) 49 - 397 U/L  Urinalysis, Routine w reflex microscopic- may I&O cath if menses     Status: Abnormal   Collection Time: 01/18/17 11:30 AM  Result Value Ref Range   Color, Urine YELLOW YELLOW   APPearance TURBID (A) CLEAR   Specific Gravity, Urine 1.017 1.005 - 1.030   pH 6.0 5.0 - 8.0   Glucose, UA NEGATIVE NEGATIVE mg/dL   Hgb urine dipstick LARGE (A) NEGATIVE   Bilirubin Urine NEGATIVE NEGATIVE   Ketones, ur NEGATIVE NEGATIVE mg/dL   Protein, ur 100 (A) NEGATIVE mg/dL   Nitrite NEGATIVE NEGATIVE   Leukocytes, UA LARGE (A) NEGATIVE   RBC / HPF TOO NUMEROUS TO COUNT 0 - 5 RBC/hpf   WBC, UA TOO NUMEROUS TO COUNT 0 - 5 WBC/hpf   Bacteria, UA RARE (A) NONE SEEN   Squamous Epithelial / LPF 0-5 (A) NONE SEEN   WBC Clumps PRESENT    Sperm, UA PRESENT   I-Stat CG4 Lactic Acid, ED     Status: None   Collection Time: 01/18/17 11:42 AM  Result Value Ref Range   Lactic Acid, Venous 1.56 0.5 - 1.9 mmol/L   Ct Hip Right Wo Contrast  Result Date: 01/18/2017 CLINICAL DATA:  Right hip pain due to a fall today. Initial encounter. EXAM: CT OF THE RIGHT HIP WITHOUT CONTRAST TECHNIQUE: Multidetector CT imaging of the right hip was performed according to the standard protocol. Multiplanar CT image reconstructions were also generated. COMPARISON:  Plain films right hip earlier today. CT abdomen and pelvis 02/15/2016. FINDINGS: Bones/Joint/Cartilage Since the prior CT scan, the patient has developed a destructive lesion in the right ilium extending into both the iliacus muscle and gluteal musculature. The lesion measures approximately 5.3 cm transverse by 4 cm AP on image 11 of series 11. No other lytic lesion is identified. The  patient does have a new sclerotic focus in the right pubic bone measuring 2.2 cm AP   by up to 1.2 cm transverse on image 48 of series 2. No other focal lesion is identified. The hip is located and no hip fracture is seen. Ligaments Suboptimally assessed by CT. Muscles and Tendons Intact. Soft tissues Although poorly defined without contrast, there is a new soft tissue mass in the right pelvis measuring approximately 5.2 cm AP by 3.6 cm transverse. This lesion is contiguous with the right pelvic wall musculature and extends inferiorly to the level of the medial wall of the acetabulum. The right bladder wall is markedly thickened. IMPRESSION: New large destructive lesion in the right iliac wing and new sclerotic lesion in the right pubic bone are likely due to metastatic disease in this patient with a history of bladder carcinoma. Marked thickening of the right bladder wall and a new large soft tissue mass in the right pelvis are also consistent with recurrent and/or metastatic tumor. Negative for hip fracture. Electronically Signed   By: Inge Rise M.D.   On: 01/18/2017 11:21   Dg Chest Port 1 View  Result Date: 01/18/2017 CLINICAL DATA:  Unwitnessed fall.  Hip pain.  Dementia. EXAM: PORTABLE CHEST 1 VIEW COMPARISON:  03/06/2016 FINDINGS: Heart size is normal. Aortic atherosclerosis. Previous surgical clips in the right hilum and lower lung. Basilar scarring right more than left. No sign of active infiltrate, mass, effusion or collapse. No acute bone finding. IMPRESSION: No active disease. No traumatic finding. Previous surgical changes in the right infrahilar region and lower lung. Pulmonary scarring. Aortic atherosclerosis. Electronically Signed   By: Nelson Chimes M.D.   On: 01/18/2017 11:18   Dg Hip Unilat  With Pelvis 2-3 Views Right  Result Date: 01/18/2017 CLINICAL DATA:  Fall EXAM: DG HIP (WITH OR WITHOUT PELVIS) 2-3V RIGHT COMPARISON:  09/19/2016 FINDINGS: Early symmetric degenerative changes in  the hips with joint space narrowing and spurring. SI joints are symmetric and unremarkable. There is a linear lucency noted in the proximal femur inferior to the intertrochanteric region on both AP views. I favor this is related to overlying soft tissue lying, but is difficult to completely exclude an intertrochanteric fracture. Consider further evaluation with CT. IMPRESSION: Lucency noted in the intertrochanteric region, likely within the overlapping soft tissues, but difficult to completely exclude intertrochanteric fracture. May consider further evaluation with CT. Electronically Signed   By: Rolm Baptise M.D.   On: 01/18/2017 10:12    Pending Labs Unresulted Labs (From admission, onward)   Start     Ordered   01/18/17 0937  Urine C&S  Once,   STAT     01/18/17 0936      Vitals/Pain Today's Vitals   01/18/17 1130 01/18/17 1230 01/18/17 1300 01/18/17 1400  BP: (!) 126/49 (!) 104/53 138/67 109/60  Pulse: 95 83 91 90  Resp:  16  16  Temp:      TempSrc:      SpO2: 100% 100% 100% 100%    Isolation Precautions No active isolations  Medications Medications  cefTRIAXone (ROCEPHIN) 1 g in dextrose 5 % 50 mL IVPB (1 g Intravenous New Bag/Given 01/18/17 1403)  sodium chloride 0.9 % bolus 1,000 mL (1,000 mLs Intravenous New Bag/Given 01/18/17 1403)    Followed by  0.9 %  sodium chloride infusion (not administered)  sodium chloride 0.9 % bolus 500 mL (0 mLs Intravenous Stopped 01/18/17 1350)    Mobility non-ambulatory

## 2017-01-18 NOTE — ED Notes (Signed)
Pericare completed and patient wet brief changed. Condom catheter placed to patient's penis for urine collection as patient has history of dementia and is unable to know when he is voiding.

## 2017-01-18 NOTE — ED Triage Notes (Signed)
Patient brought in by EMS from [facility] with c/o unwitnessed fall at approx 1900 01/17/17. Shortly after, the patient complained of right hip pain to the facility.  Facility also reports foul odor and dysuria. Patient has a history of dementia and the patient is not alert at baseline. Per staff, patient has been showing signs of decreased appetite x2 days. EMS Vitals: 108/70, HR 92, RR 18, O2 sat 99% RA

## 2017-01-18 NOTE — H&P (Signed)
History and Physical   Brett Santiago WUJ:[id number] DOB: 1929/11/28 DOA: 01/18/2017  Referring MD/NP/PA: Dr. Johnney Killian   PCP: Roger Shelter, MD (Inactive)   Patient coming from: nursing home   Chief Complaint: confusion   HPI: Brett Santiago is a 84 y.o. male with known colon cancer in 2008, vascular dementia, bladder cancer, follows with urologist Dr. Beatrix Fetters, resident of SNF, presented from facility for evaluation of sudden onset of change in mental status. Please note that pt is not able to provide detailed history due to AMS and per record review, pt apparently sustained unwitnessed fall at the facility and has subsequently complained of the right hip pain. There is also a reports of foul smelling urine that was noted at the facility for two days.   ED Course: In ED, pt is hemodynamically stable but confused and unable to provide any history, VS reviewed and stable, blood work notable for WBC 16 K, Cr 2.2 and K 5.3. Imaging studies negative for hip fracture but notable for new large destructive lesion in the right iliac wing and new lesion in the right pubic bone likely due to metastatic disease. Also noted new large mass in the right pelvis consistent with recurrent vs metastatic tumor.   Review of Systems:  Unable to obtained due to AMS  Past Medical History:  Diagnosis Date  . Anemia   . Bladder cancer ([place])    UROLOGIST-  DAHLSTEDT  . BPH with elevated PSA   . Full dentures   . 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 History of solitary pulmonary nodule    02-18-2007  s/p  R VATS w/ resection right lung nodule and lymph bx's--- per pathology Hamartoma and negative nodes  . Osteoporosis   . Sundowning   . Vascular dementia without behavioral disturbance   . Wears glasses     Past Surgical History:  Procedure Laterality Date  . APPENDECTOMY    .  COLONOSCOPY  last one 09-27-2011  . LAPAROSCOPIC RIGHT HEMICOLECTOMY  08/20/2006  . [place]  . TONSILLECTOMY  child  . TRANSURETHRAL RESECTION OF BLADDER TUMOR N/A 03/09/2016   Procedure: TRANSURETHRAL RESECTION OF BLADDER TUMOR (TURBT) AND EPIRUBICIN INSTILLATION;  Surgeon: Franchot Gallo, MD;  [facility];  Service: Urology;  Laterality: N/A;  . TRANSURETHRAL RESECTION OF BLADDER TUMOR N/A 05/25/2016   Procedure: REPEAT TRANSURETHRAL RESECTION OF BLADDER TUMOR (TURBT) WITH LOOP;  Surgeon: Franchot Gallo, MD;  Location: [facility];  Service: Urology;  Laterality: N/A;  . VIDEO ASSISTED THORACOSCOPY (VATS)/WEDGE RESECTION Right 02/18/2007   Right Lung Nodule/  Opened thoracoscopic biopsy right hilar lymph nodes   Social Hx:  reports that he quit smoking about 41 years ago. His smoking use included cigarettes. He quit after 20.00 years of use. he has never used smokeless tobacco. He reports that he does not drink alcohol or use drugs.  No Known Allergies  Family History  Problem Relation Age of Onset  . Colon cancer Neg Hx     Prior to Admission medications   Medication Sig Start Date End Date Taking? Authorizing Provider  citalopram (CELEXA) 10 MG tablet Take 1 tablet (10 mg total) by mouth daily. 12/05/16 01/18/17 Yes Ethelene Hal, NP  divalproex (DEPAKOTE SPRINKLE) 125 MG capsule Take 250 mg by mouth at bedtime.   Yes  [provider]  donepezil (ARICEPT) 5 MG tablet Take 1 tablet by mouth at bedtime.  09/01/11  Yes [provider]  haloperidol (HALDOL) 0.5 MG tablet Take 0.5 mg by mouth every 4 (four) hours as needed for agitation.   Yes [provider]  mirtazapine (REMERON) 15 MG tablet Take 15 mg by mouth at bedtime.   Yes [provider]  QUEtiapine (SEROQUEL) 25 MG tablet Take 25 mg by mouth at bedtime.   Yes [provider]    Physical Exam: Vitals:   01/18/17  1130 01/18/17 1230 01/18/17 1300 01/18/17 1400  BP: (!) 126/49 (!) 104/53 138/67 109/60  Pulse: 95 83 91 90  Resp:  16  16  Temp:      TempSrc:      SpO2: 100% 100% 100% 100%    Constitutional: confused and intermittently agitated but not in acute distress  Vitals:   01/18/17 1130 01/18/17 1230 01/18/17 1300 01/18/17 1400  BP: (!) 126/49 (!) 104/53 138/67 109/60  Pulse: 95 83 91 90  Resp:  16  16  Temp:      TempSrc:      SpO2: 100% 100% 100% 100%   Eyes: PERRL ENMT: Mucous membranes dry  Neck: normal, supple, no masses, no thyromegaly Respiratory: diminished breath sounds at bases, no rhonchi  Cardiovascular: Regular rate and rhythm, no rubs / gallops.  Abdomen: tenderness in lower abd quadrants with deep palpation. No hepatosplenomegaly.  Musculoskeletal: no clubbing / cyanosis. TTP in the right hip area  Skin: no rashes, lesions, ulcers. No induration Neurologic: alert but confused, moving extremities, sensation appears intact to soft touch bilaterally in upper and lower extremities  Psychiatric: difficult to assess due to AMS   Labs on Admission: I have personally reviewed following labs and imaging studies  CBC: Recent Labs  Lab 01/18/17 1100  WBC 16.2*  NEUTROABS 13.8*  HGB 12.4*  HCT 39.3  MCV 83.3  PLT 027   Basic Metabolic Panel: Recent Labs  Lab 01/18/17 1100  NA 144  K 5.3*  CL 108  CO2 28  GLUCOSE 99  BUN 58*  CREATININE 2.20*  CALCIUM 8.7*   Liver Function Tests: Recent Labs  Lab 01/18/17 1100  AST 38  ALT 14*  ALKPHOS 115  BILITOT 1.2  PROT 6.6  ALBUMIN 2.8*   Cardiac Enzymes: Recent Labs  Lab 01/18/17 1100  CKTOTAL 458*   Urine analysis:    Component Value Date/Time   COLORURINE YELLOW 01/18/2017 1130   APPEARANCEUR TURBID (A) 01/18/2017 1130   LABSPEC 1.017 01/18/2017 1130   PHURINE 6.0 01/18/2017 1130   GLUCOSEU NEGATIVE 01/18/2017 1130   HGBUR LARGE (A) 01/18/2017 1130   BILIRUBINUR NEGATIVE 01/18/2017 1130    KETONESUR NEGATIVE 01/18/2017 1130   PROTEINUR 100 (A) 01/18/2017 1130   UROBILINOGEN 0.2 02/14/2007 1101   NITRITE NEGATIVE 01/18/2017 1130   LEUKOCYTESUR LARGE (A) 01/18/2017 1130   Radiological Exams on Admission: Ct Hip Right Wo Contrast Result Date: 01/18/2017 New large destructive lesion in the right iliac wing and new sclerotic lesion in the right pubic bone are likely due to metastatic disease in this patient with a history of bladder carcinoma. Marked thickening of the right bladder wall and a new large soft tissue mass in the right pelvis are also consistent with recurrent and/or metastatic tumor. Negative for hip fracture.   Dg Chest Port 1 View Result Date: 01/18/2017 No active disease. No traumatic finding. Previous surgical changes in the right infrahilar  region and lower lung. Pulmonary scarring. Aortic atherosclerosis.   Dg Hip Unilat  With Pelvis 2-3 Views Right Result Date: 01/18/2017 Lucency noted in the intertrochanteric region, likely within the overlapping soft tissues, but difficult to completely exclude intertrochanteric fracture. May consider further evaluation with CT.  EKG: pending   Assessment/Plan Active Problems: Acute metabolic encephalopathy, known vascular dementia  - this appears to be multifactorial and secondary to UTI, dehydration, fall, failure to thrive in pt with known vascular dementia at baseline  - admit to telemetry unit for further evaluation - will treat UTI, place on IVF, conservative care and follow clinical response  Leukocytosis  - appears to be reactive process, fall, UTI - pt placed on Rocephin and will need to follow up on urine cultures  - CBC in AM    Fall with subsequent right hip pain, mild rhabdomyolysis  - no evidence of hip fracture on imaging studies but tumor could be contributing - I have consulted PCT for further [place] discussions - will allow analgesia as needed  - CK levels mildly elevated, will provide IVF and repeat CK  in AM    Acute kidney injury - review of record indicate Cr in November 2018 was 1.27 - this appears to be pre renal in etiology - will place on IVF as noted above and will repeat BMP in AM    New large destructive lesion in the right iliac wing  - new sclerotic lesion in the right pubic bone are likely due to metastatic disease in this patient with a history of bladder carcinoma.  - new large soft tissue mass in the right pelvis are also consistent with recurrent and/or metastatic tumor - this was discussed with wife at bedside - open to PCT consultation for further goals of care discussions - will provide analgesia as needed as wife is very clear she wants her husband to be comfortable     Hyperkalemia - mild, repeat BMP in AM  DVT prophylaxis: Lovenox SQ Code Status: Full  Family Communication: Wife updated at bedside  Disposition Plan: to be determined but will likely go back to same SNF Consults called: PCT Admission status: Inpatient    Faye Ramsay MD [facility] Pager [phone]  If 7PM-7AM, please contact night-coverage [email or web address] Password [email or web address]  01/18/2017, 2:42 PM

## 2017-01-18 NOTE — ED Provider Notes (Signed)
[facility] DEPT Provider Note   CSN: [id number] Arrival date & time: 01/18/17  0840     History   Chief Complaint Chief Complaint  Patient presents with  . Fall  . Dysuria    HPI AVIR DERUITER is a 84 y.o. male.  HPI Patient is an extremely limited historian.  History is from EMS report from nursing home.  Reportedly patient fell, unwitnessed yesterday.  He subsequently complained of right hip pain.  Patient does endorse that he fell but can give no additional details.  Also reportedly poor oral intake for 2 days and foul-smelling urine. Past Medical History:  Diagnosis Date  . Anemia   . Bladder cancer ([place])    UROLOGIST-  DAHLSTEDT  . BPH with elevated PSA   . Full dentures   . 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 History of solitary pulmonary nodule    02-18-2007  s/p  R VATS w/ resection right lung nodule and lymph bx's--- per pathology Hamartoma and negative nodes  . Osteoporosis   . Sundowning   . Vascular dementia without behavioral disturbance   . Wears glasses     Patient Active Problem List   Diagnosis Date Noted  . Altered mental status 01/18/2017  . Dementia with aggressive behavior 12/03/2016  . Bladder neoplasm 05/25/2016  . Transitional cell bladder cancer ([place]) 03/09/2016  . Pressure injury of skin 02/17/2016  . Weakness generalized 02/16/2016  . Vascular dementia without behavioral disturbance   . CAP (community acquired pneumonia) 02/15/2016  . COLON CANCER 04/29/2007  . COLONIC POLYPS 04/29/2007    Past Surgical History:  Procedure Laterality Date  . APPENDECTOMY    . COLONOSCOPY  last one 09-27-2011  . LAPAROSCOPIC RIGHT HEMICOLECTOMY  08/20/2006  . [facility]  . TONSILLECTOMY  child  . TRANSURETHRAL RESECTION OF BLADDER TUMOR N/A 03/09/2016   Procedure: TRANSURETHRAL  RESECTION OF BLADDER TUMOR (TURBT) AND EPIRUBICIN INSTILLATION;  Surgeon: Franchot Gallo, MD;  Location: [facility];  Service: Urology;  Laterality: N/A;  . TRANSURETHRAL RESECTION OF BLADDER TUMOR N/A 05/25/2016   Procedure: REPEAT TRANSURETHRAL RESECTION OF BLADDER TUMOR (TURBT) WITH LOOP;  Surgeon: Franchot Gallo, MD;  Location[facility];  Service: Urology;  Laterality: N/A;  . VIDEO ASSISTED THORACOSCOPY (VATS)/WEDGE RESECTION Right 02/18/2007   Right Lung Nodule/  Opened thoracoscopic biopsy right hilar lymph nodes       Home Medications    Prior to Admission medications   Medication Sig Start Date End Date Taking? Authorizing Provider  citalopram (CELEXA) 10 MG tablet Take 1 tablet (10 mg total) by mouth daily. 12/05/16 01/18/17 Yes Ethelene Hal, NP  divalproex (DEPAKOTE SPRINKLE) 125 MG capsule Take 250 mg by mouth at bedtime.   Yes [provider]  donepezil (ARICEPT) 5 MG tablet Take 1 tablet by mouth at bedtime.  09/01/11  Yes [provider]  haloperidol (HALDOL) 0.5 MG tablet Take 0.5 mg by mouth every 4 (four) hours as needed for agitation.   Yes [provider]  mirtazapine (REMERON) 15 MG tablet Take 15 mg by mouth at bedtime.   Yes [provider]  Multiple Vitamins-Minerals (MULTIVITAMIN PO) Take 1 tablet by mouth daily.   Yes [provider]  QUEtiapine (SEROQUEL) 25 MG tablet Take 25 mg by mouth at bedtime.   Yes  [provider]    Family History Family History  Problem Relation Age of Onset  . Colon cancer Neg Hx     Social History Social History   Tobacco Use  . Smoking status: Former Smoker    Years: 20.00    Types: Cigarettes    Last attempt to quit: 03/06/1975    Years since quitting: 41.9  . Smokeless tobacco: Never Used  Substance Use Topics  . Alcohol use: No  . Drug use: No     Allergies   Patient has no known allergies.   Review of  Systems Review of Systems  Level 5 caveat cannot obtain patient dementia. Physical Exam Updated Vital Signs BP (!) 104/53 (BP Location: Left Arm)   Pulse 83   Temp 97.8 F (36.6 C) (Oral)   Resp 16   SpO2 100%   Physical Exam  Constitutional:  Patient resting quietly with eyes closed as I enter the room.  When I begin talking to him, he does make one sentence responses.  He also objects to moving his covers and parts of examination.  He however seems quite demented and confused.  HENT:  Head: Normocephalic and atraumatic.  No evident facial trauma.  Mucous membranes extremely dry.  Eyes: EOM are normal.  Neck: Neck supple.  Cardiovascular: Normal rate, regular rhythm, normal heart sounds and intact distal pulses.  Pulmonary/Chest:  Lungs grossly clear.  Patient does not show signs of respiratory distress.  Cannot perform true posterior auscultation due to patient resistance to moving.  Patient does not seem to have any pain to compression of chest wall.  Abdominal:  Abdomen is scaphoid.  He does not seem to have pain but he does object to my removing his covers and diapers.  Musculoskeletal:  Right lower extremity has peripheral edema 2+ as compared to left lower extremity 1+.  Legs are appropriately aligned.  Patient does object to be moving his legs and removing his socks but does not seem to be more painful to him one side to the other.  Neurological:  Patient is mostly resting with eyes closed.  He however makes one sentence replies but does not seem well oriented.  Comments are such as "I fell" "do not do that" "why the hell you take my socks off".  He does not really follow commands to assist in exam.  No evident localizing motor deficits to extremities.  He uses his upper extremities to pull his blankets back up around him.  Skin: Skin is warm and dry.     ED Treatments / Results  Labs (all labs ordered are listed, but only abnormal results are displayed) Labs Reviewed   URINALYSIS, ROUTINE W REFLEX MICROSCOPIC - Abnormal; Notable for the following components:      Result Value   APPearance TURBID (*)    Hgb urine dipstick LARGE (*)    Protein, ur 100 (*)    Leukocytes, UA LARGE (*)    Bacteria, UA RARE (*)    Squamous Epithelial / LPF 0-5 (*)    All other components within normal limits  COMPREHENSIVE METABOLIC PANEL - Abnormal; Notable for the following components:   Potassium 5.3 (*)    BUN 58 (*)    Creatinine, Ser 2.20 (*)    Calcium 8.7 (*)    Albumin 2.8 (*)    ALT 14 (*)    GFR calc non Af Amer 26 (*)    GFR calc Af Amer 30 (*)    All  other components within normal limits  CBC WITH DIFFERENTIAL/PLATELET - Abnormal; Notable for the following components:   WBC 16.2 (*)    Hemoglobin 12.4 (*)    Neutro Abs 13.8 (*)    Monocytes Absolute 1.1 (*)    All other components within normal limits  CK - Abnormal; Notable for the following components:   Total CK 458 (*)    All other components within normal limits  URINE CULTURE  I-STAT CG4 LACTIC ACID, ED  I-STAT CG4 LACTIC ACID, ED    EKG  EKG Interpretation None       Radiology Ct Hip Right Wo Contrast  Result Date: 01/18/2017 CLINICAL DATA:  Right hip pain due to a fall today. Initial encounter. EXAM: CT OF THE RIGHT HIP WITHOUT CONTRAST TECHNIQUE: Multidetector CT imaging of the right hip was performed according to the standard protocol. Multiplanar CT image reconstructions were also generated. COMPARISON:  Plain films right hip earlier today. CT abdomen and pelvis 02/15/2016. FINDINGS: Bones/Joint/Cartilage Since the prior CT scan, the patient has developed a destructive lesion in the right ilium extending into both the iliacus muscle and gluteal musculature. The lesion measures approximately 5.3 cm transverse by 4 cm AP on image 11 of series 11. No other lytic lesion is identified. The patient does have a new sclerotic focus in the right pubic bone measuring 2.2 cm AP by up to 1.2 cm  transverse on image 48 of series 2. No other focal lesion is identified. The hip is located and no hip fracture is seen. Ligaments Suboptimally assessed by CT. Muscles and Tendons Intact. Soft tissues Although poorly defined without contrast, there is a new soft tissue mass in the right pelvis measuring approximately 5.2 cm AP by 3.6 cm transverse. This lesion is contiguous with the right pelvic wall musculature and extends inferiorly to the level of the medial wall of the acetabulum. The right bladder wall is markedly thickened. IMPRESSION: New large destructive lesion in the right iliac wing and new sclerotic lesion in the right pubic bone are likely due to metastatic disease in this patient with a history of bladder carcinoma. Marked thickening of the right bladder wall and a new large soft tissue mass in the right pelvis are also consistent with recurrent and/or metastatic tumor. Negative for hip fracture. Electronically Signed   By: Inge Rise M.D.   On: 01/18/2017 11:21   Dg Chest Port 1 View  Result Date: 01/18/2017 CLINICAL DATA:  Unwitnessed fall.  Hip pain.  Dementia. EXAM: PORTABLE CHEST 1 VIEW COMPARISON:  03/06/2016 FINDINGS: Heart size is normal. Aortic atherosclerosis. Previous surgical clips in the right hilum and lower lung. Basilar scarring right more than left. No sign of active infiltrate, mass, effusion or collapse. No acute bone finding. IMPRESSION: No active disease. No traumatic finding. Previous surgical changes in the right infrahilar region and lower lung. Pulmonary scarring. Aortic atherosclerosis. Electronically Signed   By: Nelson Chimes M.D.   On: 01/18/2017 11:18   Dg Hip Unilat  With Pelvis 2-3 Views Right  Result Date: 01/18/2017 CLINICAL DATA:  Fall EXAM: DG HIP (WITH OR WITHOUT PELVIS) 2-3V RIGHT COMPARISON:  09/19/2016 FINDINGS: Early symmetric degenerative changes in the hips with joint space narrowing and spurring. SI joints are symmetric and unremarkable. There is a  linear lucency noted in the proximal femur inferior to the intertrochanteric region on both AP views. I favor this is related to overlying soft tissue lying, but is difficult to completely exclude an intertrochanteric fracture.  Consider further evaluation with CT. IMPRESSION: Lucency noted in the intertrochanteric region, likely within the overlapping soft tissues, but difficult to completely exclude intertrochanteric fracture. May consider further evaluation with CT. Electronically Signed   By: Rolm Baptise M.D.   On: 01/18/2017 10:12    Procedures Procedures (including critical care time)  Medications Ordered in ED Medications  cefTRIAXone (ROCEPHIN) 1 g in dextrose 5 % 50 mL IVPB (not administered)  sodium chloride 0.9 % bolus 1,000 mL (not administered)    Followed by  0.9 %  sodium chloride infusion (not administered)  sodium chloride 0.9 % bolus 500 mL (500 mLs Intravenous New Bag/Given 01/18/17 1145)     Initial Impression / Assessment and Plan / ED Course  I have reviewed the triage vital signs and the nursing notes.  Pertinent labs & imaging results that were available during my care of the patient were reviewed by me and considered in my medical decision making (see chart for details).    Consult: Dr. Doyle Askew for admission. Final Clinical Impressions(s) / ED Diagnoses   Final diagnoses:  AKI (acute kidney injury) ([place])  Dehydration  Acute cystitis with hematuria  Fall, initial encounter  Metastatic cancer [facility])  Patient with multiple medical problems.  He has acute derangements of renal insufficiency and dehydration with UTI.  Also history is complicated by metastatic cancer.  Plan will be for admission.  ED Discharge Orders    None       Charlesetta Shanks, MD 01/18/17 1309

## 2017-01-18 NOTE — Progress Notes (Signed)
Palliative Medicine consult noted. Due to high referral volume, there may be a delay seeing this patient. Please call the Palliative Medicine Team office at [phone] if recommendations are needed in the interim.  Thank you for inviting [place] to see this patient.  Marjie Skiff Shamra Bradeen, RN, BSN, [facility] 01/18/2017 2:55 PM Office [phone]

## 2017-01-18 NOTE — ED Notes (Signed)
Bed: WA04 Expected date:  Expected time:  Means of arrival:  Comments: 

## 2017-01-19 DIAGNOSIS — E860 Dehydration: Secondary | ICD-10-CM

## 2017-01-19 DIAGNOSIS — C799 Secondary malignant neoplasm of unspecified site: Secondary | ICD-10-CM

## 2017-01-19 DIAGNOSIS — N179 Acute kidney failure, unspecified: Principal | ICD-10-CM

## 2017-01-19 DIAGNOSIS — R4182 Altered mental status, unspecified: Secondary | ICD-10-CM

## 2017-01-19 LAB — CK: CK TOTAL: 255 U/L (ref 49–397)

## 2017-01-19 LAB — BASIC METABOLIC PANEL
Anion gap: 7 (ref 5–15)
BUN: 56 mg/dL — AB (ref 6–20)
CALCIUM: 7.9 mg/dL — AB (ref 8.9–10.3)
CO2: 24 mmol/L (ref 22–32)
CREATININE: 1.7 mg/dL — AB (ref 0.61–1.24)
Chloride: 114 mmol/L — ABNORMAL HIGH (ref 101–111)
GFR calc Af Amer: 41 mL/min — ABNORMAL LOW (ref >60)
GFR, EST NON AFRICAN AMERICAN: 35 mL/min — AB (ref >60)
GLUCOSE: 74 mg/dL (ref 65–99)
POTASSIUM: 3.9 mmol/L (ref 3.5–5.1)
SODIUM: 145 mmol/L (ref 135–145)

## 2017-01-19 LAB — CBC
HCT: 35.3 % — ABNORMAL LOW (ref 39.0–52.0)
Hemoglobin: 10.9 g/dL — ABNORMAL LOW (ref 13.0–17.0)
MCH: 25.8 pg — ABNORMAL LOW (ref 26.0–34.0)
MCHC: 30.9 g/dL (ref 30.0–36.0)
MCV: 83.6 fL (ref 78.0–100.0)
PLATELETS: 229 10*3/uL (ref 150–400)
RBC: 4.22 MIL/uL (ref 4.22–5.81)
RDW: 14.6 % (ref 11.5–15.5)
WBC: 10.3 10*3/uL (ref 4.0–10.5)

## 2017-01-19 NOTE — Progress Notes (Signed)
Palliative Medicine Team consult was received.   We have scheduled a meeting with patient and wife for 1300 on Sunday, 01/20/17.   If there are urgent needs or questions please call [phone]. Thank you for consulting out team to assist with this patients care.  NO CHARGE NOTE  Micheline Rough, MD [place] Team [phone]

## 2017-01-19 NOTE — Progress Notes (Signed)
PROGRESS NOTE    Brett Santiago  MAU:[id number] DOB: 1929/10/12 DOA: 01/18/2017 PCP: Roger Shelter, MD (Inactive)    Brief Narrative: Brett Santiago is a 84 y.o. male with known colon cancer in 2008, vascular dementia, bladder cancer, follows with urologist Dr. Beatrix Fetters, resident of SNF, presented from facility for evaluation of sudden onset of change in mental status. Please note that pt is not able to provide detailed history due to AMS and per record review, pt apparently sustained unwitnessed fall at the facility and has subsequently complained of the right hip pain    Assessment & Plan:   Active Problems:   Altered mental status   Acute metabolic encephalopathy, differential include, worsening of the dementia vs UTI  Vs acute renal failure.  Worsening with pain.    Fall with right hip pain:  - metastatic lesions in the hip, discussed with wife at bedside, recommended hospice.   Mild rhabdo: Improved.   AKI:  Improving with hydration.  Probably from dehydration vs ATN FROM UTI.  UTI: Urine cultures growing E COLI , and on rocephin.    Metastasis in the right iliac wing and new soft tissue mass in the bladder consistent with recurrent metastatic tumour.  Palliative care consulted  Discussed the poor prognosis with the wife at bedside.    DVT prophylaxis: lovenox. Code Status:  Full code.  Family Communication: discussed with wife at bedside.  Disposition Plan:pending palliative care, most ly residential hospice.    Consultants:   Palliative care.    Procedures:    Antimicrobials: rocephin.    Subjective: Moaning in pain and confused.   Objective: Vitals:   01/18/17 1600 01/18/17 2118 01/19/17 0647 01/19/17 1334  BP:  125/60 (!) 146/64 (!) 99/56  Pulse:  98 96 (!) 103  Resp:  19 18 18   Temp:  98.1 F (36.7 C) 98 F (36.7 C) 98.5 F (36.9 C)  TempSrc:  Oral Axillary Oral  SpO2:  99% 98% 98%  Weight: 56.4 kg (124 lb 5.4 oz)  57.6 kg (127  lb)   Height: 6' (1.829 m)       Intake/Output Summary (Last 24 hours) at 01/19/2017 1714 Last data filed at 01/19/2017 1706 Gross per 24 hour  Intake 1500 ml  Output 600 ml  Net 900 ml   Filed Weights   01/18/17 1600 01/19/17 0647  Weight: 56.4 kg (124 lb 5.4 oz) 57.6 kg (127 lb)    Examination:  General exam: Appears in mild distress from hip pain on the right.  Respiratory system: Clear to auscultation. Respiratory effort normal. Cardiovascular system: S1 & S2 heard, RRR. No JVD, murmurs,  No pedal edema. Gastrointestinal system: Abdomen is nondistended, soft and nontender. No organomegaly or masses felt. Normal bowel sounds heard. Central nervous system: confused.  Extremities: tender on the right hip and any movement of the legs.  Skin: No rashes, lesions or ulcers Psychiatry: confused.    Data Reviewed: I have personally reviewed following labs and imaging studies  CBC: Recent Labs  Lab 01/18/17 1100 01/19/17 0625  WBC 16.2* 10.3  NEUTROABS 13.8*  --   HGB 12.4* 10.9*  HCT 39.3 35.3*  MCV 83.3 83.6  PLT 253 563   Basic Metabolic Panel: Recent Labs  Lab 01/18/17 1100 01/19/17 0625  NA 144 145  K 5.3* 3.9  CL 108 114*  CO2 28 24  GLUCOSE 99 74  BUN 58* 56*  CREATININE 2.20* 1.70*  CALCIUM 8.7* 7.9*   GFR:  Estimated Creatinine Clearance: 25.9 mL/min (A) (by C-G formula based on SCr of 1.7 mg/dL (H)). Liver Function Tests: Recent Labs  Lab 01/18/17 1100  AST 38  ALT 14*  ALKPHOS 115  BILITOT 1.2  PROT 6.6  ALBUMIN 2.8*   No results for input(s): LIPASE, AMYLASE in the last 168 hours. No results for input(s): AMMONIA in the last 168 hours. Coagulation Profile: No results for input(s): INR, PROTIME in the last 168 hours. Cardiac Enzymes: Recent Labs  Lab 01/18/17 1100 01/19/17 0625  CKTOTAL 458* 255   BNP (last 3 results) No results for input(s): PROBNP in the last 8760 hours. HbA1C: No results for input(s): HGBA1C in the last 72  hours. CBG: No results for input(s): GLUCAP in the last 168 hours. Lipid Profile: No results for input(s): CHOL, HDL, LDLCALC, TRIG, CHOLHDL, LDLDIRECT in the last 72 hours. Thyroid Function Tests: No results for input(s): TSH, T4TOTAL, FREET4, T3FREE, THYROIDAB in the last 72 hours. Anemia Panel: No results for input(s): VITAMINB12, FOLATE, FERRITIN, TIBC, IRON, RETICCTPCT in the last 72 hours. Sepsis Labs: Recent Labs  Lab 01/18/17 1142  LATICACIDVEN 1.56    Recent Results (from the past 240 hour(s))  Urine C&S     Status: Abnormal (Preliminary result)   Collection Time: 01/18/17 11:30 AM  Result Value Ref Range Status   Specimen Description URINE, CLEAN CATCH  Final   Special Requests NONE  Final   Culture (A)  Final    >=100,000 COLONIES/mL ESCHERICHIA COLI SUSCEPTIBILITIES TO FOLLOW Performed at [facility], [address], [place], [place] [address]    Report Status PENDING  Incomplete  MRSA PCR Screening     Status: None   Collection Time: 01/18/17  4:28 PM  Result Value Ref Range Status   MRSA by PCR NEGATIVE NEGATIVE Final    Comment:        The GeneXpert MRSA Assay (FDA approved for NASAL specimens only), is one component of a comprehensive MRSA colonization surveillance program. It is not intended to diagnose MRSA infection nor to guide or monitor treatment for MRSA infections.          Radiology Studies: Ct Hip Right Wo Contrast  Result Date: 01/18/2017 CLINICAL DATA:  Right hip pain due to a fall today. Initial encounter. EXAM: CT OF THE RIGHT HIP WITHOUT CONTRAST TECHNIQUE: Multidetector CT imaging of the right hip was performed according to the standard protocol. Multiplanar CT image reconstructions were also generated. COMPARISON:  Plain films right hip earlier today. CT abdomen and pelvis 02/15/2016. FINDINGS: Bones/Joint/Cartilage Since the prior CT scan, the patient has developed a destructive lesion in the right ilium extending into both  the iliacus muscle and gluteal musculature. The lesion measures approximately 5.3 cm transverse by 4 cm AP on image 11 of series 11. No other lytic lesion is identified. The patient does have a new sclerotic focus in the right pubic bone measuring 2.2 cm AP by up to 1.2 cm transverse on image 48 of series 2. No other focal lesion is identified. The hip is located and no hip fracture is seen. Ligaments Suboptimally assessed by CT. Muscles and Tendons Intact. Soft tissues Although poorly defined without contrast, there is a new soft tissue mass in the right pelvis measuring approximately 5.2 cm AP by 3.6 cm transverse. This lesion is contiguous with the right pelvic wall musculature and extends inferiorly to the level of the medial wall of the acetabulum. The right bladder wall is markedly thickened. IMPRESSION:  New large destructive lesion in the right iliac wing and new sclerotic lesion in the right pubic bone are likely due to metastatic disease in this patient with a history of bladder carcinoma. Marked thickening of the right bladder wall and a new large soft tissue mass in the right pelvis are also consistent with recurrent and/or metastatic tumor. Negative for hip fracture. Electronically Signed   By: Inge Rise M.D.   On: 01/18/2017 11:21   Dg Chest Port 1 View  Result Date: 01/18/2017 CLINICAL DATA:  Unwitnessed fall.  Hip pain.  Dementia. EXAM: PORTABLE CHEST 1 VIEW COMPARISON:  03/06/2016 FINDINGS: Heart size is normal. Aortic atherosclerosis. Previous surgical clips in the right hilum and lower lung. Basilar scarring right more than left. No sign of active infiltrate, mass, effusion or collapse. No acute bone finding. IMPRESSION: No active disease. No traumatic finding. Previous surgical changes in the right infrahilar region and lower lung. Pulmonary scarring. Aortic atherosclerosis. Electronically Signed   By: Nelson Chimes M.D.   On: 01/18/2017 11:18   Dg Hip Unilat  With Pelvis 2-3 Views  Right  Result Date: 01/18/2017 CLINICAL DATA:  Fall EXAM: DG HIP (WITH OR WITHOUT PELVIS) 2-3V RIGHT COMPARISON:  09/19/2016 FINDINGS: Early symmetric degenerative changes in the hips with joint space narrowing and spurring. SI joints are symmetric and unremarkable. There is a linear lucency noted in the proximal femur inferior to the intertrochanteric region on both AP views. I favor this is related to overlying soft tissue lying, but is difficult to completely exclude an intertrochanteric fracture. Consider further evaluation with CT. IMPRESSION: Lucency noted in the intertrochanteric region, likely within the overlapping soft tissues, but difficult to completely exclude intertrochanteric fracture. May consider further evaluation with CT. Electronically Signed   By: Rolm Baptise M.D.   On: 01/18/2017 10:12        Scheduled Meds: . citalopram  10 mg Oral Daily  . divalproex  250 mg Oral QHS  . donepezil  5 mg Oral QHS  . enoxaparin (LOVENOX) injection  30 mg Subcutaneous Q24H  . mirtazapine  15 mg Oral QHS  . QUEtiapine  25 mg Oral QHS   Continuous Infusions: . sodium chloride 75 mL/hr at 01/19/17 1039  . cefTRIAXone (ROCEPHIN)  IV 1 g (01/19/17 1612)     LOS: 1 day    Time spent: 35 minutes.     Hosie Poisson, MD [facility] Pager [phone]   If 7PM-7AM, please contact night-coverage [email or web address] Password [email or web address] 01/19/2017, 5:14 PM

## 2017-01-20 DIAGNOSIS — N390 Urinary tract infection, site not specified: Secondary | ICD-10-CM

## 2017-01-20 DIAGNOSIS — M25551 Pain in right hip: Secondary | ICD-10-CM

## 2017-01-20 DIAGNOSIS — N179 Acute kidney failure, unspecified: Secondary | ICD-10-CM

## 2017-01-20 DIAGNOSIS — M6282 Rhabdomyolysis: Secondary | ICD-10-CM

## 2017-01-20 LAB — URINE CULTURE: Culture: >=100000 — AB

## 2017-01-20 LAB — BASIC METABOLIC PANEL
Anion gap: 5 (ref 5–15)
BUN: 51 mg/dL — ABNORMAL HIGH (ref 6–20)
CALCIUM: 7.9 mg/dL — AB (ref 8.9–10.3)
CO2: 25 mmol/L (ref 22–32)
CREATININE: 1.53 mg/dL — AB (ref 0.61–1.24)
Chloride: 116 mmol/L — ABNORMAL HIGH (ref 101–111)
GFR calc non Af Amer: 40 mL/min — ABNORMAL LOW (ref >60)
GFR, EST AFRICAN AMERICAN: 46 mL/min — AB (ref >60)
Glucose, Bld: 97 mg/dL (ref 65–99)
Potassium: 3.8 mmol/L (ref 3.5–5.1)
SODIUM: 146 mmol/L — AB (ref 135–145)

## 2017-01-20 MED ORDER — MORPHINE SULFATE (PF) 4 MG/ML IV SOLN: 1.0000 mg | INTRAVENOUS | Status: DC | PRN

## 2017-01-20 MED ORDER — MORPHINE SULFATE (PF) 4 MG/ML IV SOLN
1.0000 mg | Freq: Four times a day (QID) | INTRAVENOUS | Status: DC
  Administered 2017-01-20 – 2017-01-23 (×5): 1 mg via INTRAVENOUS
  Filled 2017-01-20 (×5): qty 1

## 2017-01-20 NOTE — Consult Note (Signed)
Consultation Note Date: 01/20/2017   Patient Name: Brett Santiago  DOB: November 21, 1929  MRN: [id number]  Age / Sex: 84 y.o., male  PCP: Roger Shelter, MD (Inactive) Referring Physician: Hosie Poisson, MD  Reason for Consultation: Establishing goals of care  HPI/Patient Profile: 84 y.o. male  with past medical history of colon cancer 2008, vascular dementia, bladder cancer, recurrent falls admitted on 01/18/2017 with fall and decreased PO intake.  Subsequent evaluation revealed likely recurrence of bladder cancer with involvement of bony pelvis.  Clinical Assessment and Goals of Care: I met today with Brett Santiago.  He has advanced dementia and cannot participate in conversation.  I met outside the room with his wife and son.  We discussed clinical course as well as wishes moving forward in regard to advanced directives.  Concepts specific to code status and rehospitalization discussed.  We discussed difference between a aggressive medical intervention path and a palliative, comfort focused care path.  Values and goals of care important to patient and family were attempted to be elicited.  I discussed with family regarding heroic interventions at the end-of-life and they agree this would not be in line with prior expressed wishes for a natural death or be likely to lead to getting well enough to go back home. They were in agreement with changing CODE STATUS to DO NOT RESUSCITATE.  Concept of Hospice and Palliative Care were discussed  Questions and concerns addressed.   PMT will continue to support holistically.  SUMMARY OF RECOMMENDATIONS   - Family in agreement that goal moving forward is for a focus on comfort.  They are agreeable to hospice on discharge. - He is reported not to be eating more than a few bites daily, but chart also lists >80% of meals yesterday for lunch and dinner.  Discussed with RN who  will work to get a clear picture of his intake over the next 24-48 hours. - If he is not eating and drinking, his prognosis is likely < 2 weeks and family is in agreement with plan to pursue placement at [facility] for end of life care. - If his prognosis appears to be longer than this, plan will be back to [facility] with hospice support.  They may then want to transition to [facility] later on if needed.  Code Status/Advance Care Planning:  DNR  Symptom Management:   Pain: He has advanced dementia and concern that he is not able to verbalize needs effectively.  Will plan for scheduled meds every 6 hours with continued breakthrough every 2 hours as needed.  Palliative Prophylaxis:   Delirium Protocol and Frequent Pain Assessment  Additional Recommendations (Limitations, Scope, Preferences):  Comfort care  Psycho-social/Spiritual:   Desire for further Chaplaincy support:Did not address  Additional Recommendations: Caregiving  Support/Resources and Education on Hospice  Prognosis:   Likely weeks at best.  Continue to monitor intake and will reassess in 24-48 hours.  Discharge Planning: Hospice facility vs long term care with hospice     Primary Diagnoses:  Present on Admission: . Altered mental status . 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 Vascular dementia without behavioral disturbance . Dementia with aggressive behavior . Bladder neoplasm   I have reviewed the medical record, interviewed the patient and family, and examined the patient. The following aspects are pertinent.  Past Medical History:  Diagnosis Date  . Anemia   . Bladder cancer ([place])    UROLOGIST-  DAHLSTEDT  . BPH with elevated PSA   . Full dentures   . 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 History of solitary pulmonary nodule    02-18-2007  s/p  R VATS w/  resection right lung nodule and lymph bx's--- per pathology Hamartoma and negative nodes  . Osteoporosis   . Sundowning   . Vascular dementia without behavioral disturbance   . Wears glasses    Social History   Socioeconomic History  . Marital status: Married    Spouse name: None  . Number of children: None  . Years of education: None  . Highest education level: None  Social Needs  . Financial resource strain: None  . Food insecurity - worry: None  . Food insecurity - inability: None  . Transportation needs - medical: None  . Transportation needs - non-medical: None  Occupational History  . None  Tobacco Use  . Smoking status: Former Smoker    Years: 20.00    Types: Cigarettes    Last attempt to quit: 03/06/1975    Years since quitting: 41.9  . Smokeless tobacco: Never Used  Substance and Sexual Activity  . Alcohol use: No  . Drug use: No  . Sexual activity: None  Other Topics Concern  . None  Social History Narrative  . None   Family History  Problem Relation Age of Onset  . Colon cancer Neg Hx    Scheduled Meds: . citalopram  10 mg Oral Daily  . divalproex  250 mg Oral QHS  . donepezil  5 mg Oral QHS  . enoxaparin (LOVENOX) injection  30 mg Subcutaneous Q24H  . mirtazapine  15 mg Oral QHS  .  morphine injection  1 mg Intravenous Q6H  . QUEtiapine  25 mg Oral QHS   Continuous Infusions: . sodium chloride 75 mL/hr at 01/20/17 1752  . cefTRIAXone (ROCEPHIN)  IV Stopped (01/20/17 1429)   PRN Meds:.haloperidol, morphine injection, ondansetron **OR** ondansetron (ZOFRAN) IV Medications Prior to Admission:  Prior to Admission medications   Medication Sig Start Date End Date Taking? Authorizing Provider  citalopram (CELEXA) 10 MG tablet Take 1 tablet (10 mg total) by mouth daily. 12/05/16 01/18/17 Yes Ethelene Hal, NP  divalproex (DEPAKOTE SPRINKLE) 125 MG capsule Take 250 mg by mouth at bedtime.   Yes [provider]  donepezil (ARICEPT) 5 MG  tablet Take 1 tablet by mouth at bedtime.  09/01/11  Yes [provider]  haloperidol (HALDOL) 0.5 MG tablet Take 0.5 mg by mouth every 4 (four) hours as needed for agitation.   Yes [provider]  mirtazapine (REMERON) 15 MG tablet Take 15 mg by mouth at bedtime.   Yes [provider]  Multiple Vitamins-Minerals (MULTIVITAMIN PO) Take 1 tablet by mouth daily.   Yes [provider]  QUEtiapine (SEROQUEL) 25 MG tablet Take 25 mg by mouth at bedtime.   Yes [provider]   No Known Allergies Review of  Systems Unreliable historian, but denies complaints  Physical Exam General: Alert, awake, in no acute distress.  Confused.   HEENT: No bruits, no goiter, no JVD Heart: Regular rate and rhythm. No murmur appreciated. Lungs: Fair air movement, clear Abdomen: Soft, nontender, nondistended, positive bowel sounds.  Ext: thin Skin: Warm and dry  Vital Signs: BP (!) 130/59 (BP Location: Left Arm)   Pulse 100   Temp 98.8 F (37.1 C) (Oral)   Resp 18   Ht 6' (1.829 m)   Wt 55.8 kg (123 lb 0.3 oz)   SpO2 97%   BMI 16.68 kg/m  Pain Assessment: PAINAD   Pain Score: Asleep   SpO2: SpO2: 97 % O2 Device:SpO2: 97 % O2 Flow Rate: .   IO: Intake/output summary:   Intake/Output Summary (Last 24 hours) at 01/20/2017 1827 Last data filed at 01/20/2017 1536 Gross per 24 hour  Intake 1780 ml  Output 1500 ml  Net 280 ml    LBM:   Baseline Weight: Weight: 56.4 kg (124 lb 5.4 oz) Most recent weight: Weight: 55.8 kg (123 lb 0.3 oz)     Palliative Assessment/Data:   Flowsheet Rows     Most Recent Value  Intake Tab  Referral Department  Hospitalist  Unit at Time of Referral  Med/Surg Unit  Palliative Care Primary Diagnosis  Neurology  Date Notified  01/18/17  Palliative Care Type  New Palliative care  Reason for referral  Clarify Goals of Care  Date of Admission  01/18/17  Date first seen by Palliative Care  01/19/17  # of days Palliative  referral response time  1 Day(s)  # of days IP prior to Palliative referral  0  Clinical Assessment  Palliative Performance Scale Score  20%  Psychosocial & Spiritual Assessment  Palliative Care Outcomes  Patient/Family meeting held?  Yes  Who was at the meeting?  Wife and son      Time In: 60 Time Out: 1400 Time Total: 39 Greater than 50%  of this time was spent counseling and coordinating care related to the above assessment and plan.  Signed by: Micheline Rough, MD   Please contact Palliative Medicine Team phone at [phone] for questions and concerns.  For individual provider: See Shea Evans

## 2017-01-20 NOTE — Progress Notes (Signed)
PROGRESS NOTE    Brett Santiago  YIR:[id number] DOB: May 11, 1929 DOA: 01/18/2017 PCP: Roger Shelter, MD (Inactive)    Brief Narrative: Brett Santiago is a 84 y.o. male with known colon cancer in 2008, vascular dementia, bladder cancer, follows with urologist Dr. Beatrix Fetters, resident of SNF, presented from facility for evaluation of sudden onset of change in mental status. Please note that pt is not able to provide detailed history due to AMS and per record review, pt apparently sustained unwitnessed fall at the facility and has subsequently complained of the right hip pain.  Overnight no new complaints.    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  Worsening with pain.  Patient appears calm this morning but continues to be confused.  As per the family patient has dementia at baseline.   Fall with right hip pain:  - metastatic lesions in the hip, discussed with wife at bedside, recommended hospice.  Pain control.  Mild rhabdo: Improved.   AKI:  Improving with hydration.  Probably from dehydration vs ATN FROM UTI.  Repeat BMP is still pending.  UTI: Urine cultures growing E COLI , and on rocephin.  Susceptibilities are pending   Metastasis in the right iliac wing and new soft tissue mass in the bladder consistent with recurrent metastatic tumour.  Palliative care consulted  Discussed the poor prognosis with the wife at bedside.  No new complaints   Hypertension Well-controlled.  Hyperkalemia Resolved   DVT prophylaxis: lovenox. Code Status:  Full code.  Family  Communication: discussed with wife at bedside.  Disposition Plan:pending palliative care, most ly residential hospice.    Consultants:   Palliative care.    Procedures: None   Antimicrobials: rocephin.    Subjective: Calm and sleeping.  Objective: Vitals:   01/19/17 0647 01/19/17 1334 01/19/17 2147 01/20/17 0436  BP: (!) 146/64 (!) 99/56 137/80 (!) 125/54  Pulse: 96 (!) 103 68 (!) 105  Resp: 18 18 17 17   Temp: 98 F (36.7 C) 98.5 F (36.9 C) 98.1 F (36.7 C) 99.1 F (37.3 C)  TempSrc: Axillary Oral Axillary Oral  SpO2: 98% 98% 98% 97%  Weight: 57.6 kg (127 lb)   55.8 kg (123 lb 0.3 oz)  Height:        Intake/Output Summary (Last 24 hours) at 01/20/2017 0829 Last data filed at 01/20/2017 0442 Gross per 24 hour  Intake 2125 ml  Output 1000 ml  Net 1125 ml   Filed Weights   01/18/17 1600 01/19/17 0647 01/20/17 0436  Weight: 56.4 kg (124 lb 5.4 oz) 57.6 kg (127 lb) 55.8 kg (123 lb 0.3 oz)    Examination:  General exam: Sleeping comfortably Respiratory system: Clear to auscultation. Respiratory effort normal.  No wheezing or rhonchi Cardiovascular system: S1 & S2 heard, RRR. No JVD, murmurs,  No pedal edema. Gastrointestinal system: Abdomen is soft, nontender, nondistended, good bowel sounds Central nervous system: confused.  Able to move his extremities Extremities: tender on the right hip and any movement of the legs.  Psychiatry: confused.  No agitation   Data Reviewed: I  have personally reviewed following labs and imaging studies  CBC: Recent Labs  Lab 01/18/17 1100 01/19/17 0625  WBC 16.2* 10.3  NEUTROABS 13.8*  --   HGB 12.4* 10.9*  HCT 39.3 35.3*  MCV 83.3 83.6  PLT 253 703   Basic Metabolic Panel: Recent Labs  Lab 01/18/17 1100 01/19/17 0625  NA 144 145  K 5.3* 3.9  CL 108 114*  CO2 28 24  GLUCOSE 99 74  BUN 58* 56*  CREATININE 2.20* 1.70*  CALCIUM 8.7* 7.9*   GFR: Estimated Creatinine Clearance: 25.1 mL/min (A) (by C-G formula  based on SCr of 1.7 mg/dL (H)). Liver Function Tests: Recent Labs  Lab 01/18/17 1100  AST 38  ALT 14*  ALKPHOS 115  BILITOT 1.2  PROT 6.6  ALBUMIN 2.8*   No results for input(s): LIPASE, AMYLASE in the last 168 hours. No results for input(s): AMMONIA in the last 168 hours. Coagulation Profile: No results for input(s): INR, PROTIME in the last 168 hours. Cardiac Enzymes: Recent Labs  Lab 01/18/17 1100 01/19/17 0625  CKTOTAL 458* 255   BNP (last 3 results) No results for input(s): PROBNP in the last 8760 hours. HbA1C: No results for input(s): HGBA1C in the last 72 hours. CBG: No results for input(s): GLUCAP in the last 168 hours. Lipid Profile: No results for input(s): CHOL, HDL, LDLCALC, TRIG, CHOLHDL, LDLDIRECT in the last 72 hours. Thyroid Function Tests: No results for input(s): TSH, T4TOTAL, FREET4, T3FREE, THYROIDAB in the last 72 hours. Anemia Panel: No results for input(s): VITAMINB12, FOLATE, FERRITIN, TIBC, IRON, RETICCTPCT in the last 72 hours. Sepsis Labs: Recent Labs  Lab 01/18/17 1142  LATICACIDVEN 1.56    Recent Results (from the past 240 hour(s))  Urine C&S     Status: Abnormal (Preliminary result)   Collection Time: 01/18/17 11:30 AM  Result Value Ref Range Status   Specimen Description URINE, CLEAN CATCH  Final   Special Requests NONE  Final   Culture (A)  Final    >=100,000 COLONIES/mL ESCHERICHIA COLI SUSCEPTIBILITIES TO FOLLOW Performed at [facility], [address] [place], [place] [address]    Report Status PENDING  Incomplete  MRSA PCR Screening     Status: None   Collection Time: 01/18/17  4:28 PM  Result Value Ref Range Status   MRSA by PCR NEGATIVE NEGATIVE Final    Comment:        The GeneXpert MRSA Assay (FDA approved for NASAL specimens only), is one component of a comprehensive MRSA colonization surveillance program. It is not intended to diagnose MRSA infection nor to guide or monitor treatment for MRSA  infections.          Radiology Studies: Ct Hip Right Wo Contrast  Result Date: 01/18/2017 CLINICAL DATA:  Right hip pain due to a fall today. Initial encounter. EXAM: CT OF THE RIGHT HIP WITHOUT CONTRAST TECHNIQUE: Multidetector CT imaging of the right hip was performed according to the standard protocol. Multiplanar CT image reconstructions were also generated. COMPARISON:  Plain films right hip earlier today. CT abdomen and pelvis 02/15/2016. FINDINGS: Bones/Joint/Cartilage Since the prior CT scan, the patient has developed a destructive lesion in the right ilium extending into both the iliacus muscle and gluteal musculature. The lesion measures approximately 5.3 cm transverse by 4 cm AP on image 11 of series 11. No other lytic lesion is identified. The patient does have a new sclerotic focus in the right pubic bone measuring 2.2 cm AP by up to  1.2 cm transverse on image 48 of series 2. No other focal lesion is identified. The hip is located and no hip fracture is seen. Ligaments Suboptimally assessed by CT. Muscles and Tendons Intact. Soft tissues Although poorly defined without contrast, there is a new soft tissue mass in the right pelvis measuring approximately 5.2 cm AP by 3.6 cm transverse. This lesion is contiguous with the right pelvic wall musculature and extends inferiorly to the level of the medial wall of the acetabulum. The right bladder wall is markedly thickened. IMPRESSION: New large destructive lesion in the right iliac wing and new sclerotic lesion in the right pubic bone are likely due to metastatic disease in this patient with a history of bladder carcinoma. Marked thickening of the right bladder wall and a new large soft tissue mass in the right pelvis are also consistent with recurrent and/or metastatic tumor. Negative for hip fracture. Electronically Signed   By: Inge Rise M.D.   On: 01/18/2017 11:21   Dg Chest Port 1 View  Result Date: 01/18/2017 CLINICAL DATA:   Unwitnessed fall.  Hip pain.  Dementia. EXAM: PORTABLE CHEST 1 VIEW COMPARISON:  03/06/2016 FINDINGS: Heart size is normal. Aortic atherosclerosis. Previous surgical clips in the right hilum and lower lung. Basilar scarring right more than left. No sign of active infiltrate, mass, effusion or collapse. No acute bone finding. IMPRESSION: No active disease. No traumatic finding. Previous surgical changes in the right infrahilar region and lower lung. Pulmonary scarring. Aortic atherosclerosis. Electronically Signed   By: Nelson Chimes M.D.   On: 01/18/2017 11:18   Dg Hip Unilat  With Pelvis 2-3 Views Right  Result Date: 01/18/2017 CLINICAL DATA:  Fall EXAM: DG HIP (WITH OR WITHOUT PELVIS) 2-3V RIGHT COMPARISON:  09/19/2016 FINDINGS: Early symmetric degenerative changes in the hips with joint space narrowing and spurring. SI joints are symmetric and unremarkable. There is a linear lucency noted in the proximal femur inferior to the intertrochanteric region on both AP views. I favor this is related to overlying soft tissue lying, but is difficult to completely exclude an intertrochanteric fracture. Consider further evaluation with CT. IMPRESSION: Lucency noted in the intertrochanteric region, likely within the overlapping soft tissues, but difficult to completely exclude intertrochanteric fracture. May consider further evaluation with CT. Electronically Signed   By: Rolm Baptise M.D.   On: 01/18/2017 10:12        Scheduled Meds: . citalopram  10 mg Oral Daily  . divalproex  250 mg Oral QHS  . donepezil  5 mg Oral QHS  . enoxaparin (LOVENOX) injection  30 mg Subcutaneous Q24H  . mirtazapine  15 mg Oral QHS  . QUEtiapine  25 mg Oral QHS   Continuous Infusions: . sodium chloride 75 mL/hr at 01/20/17 0316  . cefTRIAXone (ROCEPHIN)  IV Stopped (01/19/17 1801)     LOS: 2 days    Time spent: 35 minutes.     Hosie Poisson, MD [facility] Pager [phone]   If 7PM-7AM, please contact  night-coverage [email or web address] Password [email or web address] 01/20/2017, 8:29 AM

## 2017-01-21 DIAGNOSIS — Z515 Encounter for palliative care: Secondary | ICD-10-CM

## 2017-01-21 DIAGNOSIS — Z7189 Other specified counseling: Secondary | ICD-10-CM

## 2017-01-21 DIAGNOSIS — D494 Neoplasm of unspecified behavior of bladder: Secondary | ICD-10-CM

## 2017-01-21 NOTE — Progress Notes (Signed)
Palliative care progress note  Reason for consult: Goals of care line of continued decline with advanced dementia and bladder cancer likely now recurrent with bony metastases  Saw and examined Brett Santiago today and spoke with his wife, Zigmund Daniel, in his room.  I also discussed with his bedside RN.  Overall, Mr. Hemme is less interactive, has not been awake, and is not taking in any meaningful nutrition or hydration.  His wife reports feeding him 2 bites of pudding for dinner which he then proceeded to tongue thrust back out.  I am concerned in seeing him today that we are approaching end of life.  Discussed stopping IV fluids and antibiotics with his wife as I do not think they are contributing to his care or comfort at this time.  She is in agreement with this.  Hospice and palliative care [facility] is noted to have been notified of her interest in [facility].  Based upon seeing him today, I do think that there is high likelihood that we are in the last couple of weeks of life and would be in agreement with the plan to transition him to [facility] for end-of-life care.  Total time: 25 minutes Greater than 50%  of this time was spent counseling and coordinating care related to the above assessment and plan.  Micheline Rough, MD [place] Team [phone]

## 2017-01-21 NOTE — Care Management Note (Signed)
Case Management Note  Patient Details  Name: Brett Santiago MRN: [id number] Date of Birth: [birth year]  Subjective/Objective:                  urosepsis  Action/Plan: Date:  January 21, 2017 Chart reviewed for concurrent status and case management needs.  Will continue to follow patient progress.  Discharge Planning: following for needs  Expected discharge date: January 24 2017 Velva Harman, BSN, [place], [place]   Expected Discharge Date:  (UNKNOWN)               Expected Discharge Plan:  Home/Self Care  In-House Referral:  Clinical Social Work  Discharge planning Services  CM Consult  Post Acute Care Choice:    Choice offered to:     DME Arranged:    DME Agency:     HH Arranged:    [place] Agency:     Status of Service:  In process, will continue to follow  If discussed at Long Length of Stay Meetings, dates discussed:    Additional Comments:  Leeroy Cha, RN 01/21/2017, 9:41 AM

## 2017-01-21 NOTE — Care Management Important Message (Addendum)
Important Message  Patient Details IM Letter given to Rhonda/Case Manager to present to Patient Name: Brett Santiago MRN: [id number] Date of Birth: 1929-09-19   Medicare Important Message Given:  Yes    Kerin Salen 01/21/2017, 12:06 [place] Message  Patient Details  Name: Brett Santiago MRN: [id number] Date of Birth: 09-May-1929   Medicare Important Message Given:  Yes    Kerin Salen 01/21/2017, 12:06 PM

## 2017-01-21 NOTE — Progress Notes (Signed)
PROGRESS NOTE    Brett Santiago  VQM:[id number] DOB: 17-Sep-1929 DOA: 01/18/2017 PCP: Roger Shelter, MD (Inactive)    Brief Narrative: Brett Santiago is a 84 y.o. male with known colon cancer in 2008, vascular dementia, bladder cancer, follows with urologist Dr. Beatrix Fetters, resident of SNF, presented from facility for evaluation of sudden onset of change in mental status. Please note that pt is not able to provide detailed history due to AMS and per record review, pt apparently sustained unwitnessed fall at the facility and has subsequently complained of the right hip pain.     Pt continues decline despite antibiotics , fluids, and is approaching end of life.  Palliative consulted and plan for residential hospice if he is stable for transfer.     Assessment & Plan:   Active Problems:   Weakness generalized   Vascular dementia without behavioral disturbance   Transitional cell bladder cancer ([place])   Bladder neoplasm   Dementia with aggressive behavior   Altered mental status   Right hip pain   Urinary tract infection   Rhabdomyolysis   AKI (acute kidney injury) ([place])   Acute metabolic encephalopathy, differential include, worsening of the dementia vs UTI  Vs acute renal failure.  Worsening with pain.     Pt continues decline despite antibiotics , fluids, and is approaching end of life.  Palliative consulted and plan for residential hospice if he is stable for transfer.   Fall with right hip pain:  - metastatic lesions in the hip, discussed with wife at bedside, recommended hospice.  Pain control.  Mild rhabdo: Improved.   AKI:  Improving with hydration.  Probably from dehydration vs ATN FROM UTI.    UTI: Urine cultures growing E COLI , and was on rocephin. D/c antibiotics as pt is approaching end of life.    Metastasis in the right iliac wing and new soft tissue mass in the bladder consistent with recurrent metastatic tumour.  Palliative care consulted  Discussed  the poor prognosis with the wife at bedside.     Hypertension Well-controlled.  Hyperkalemia Resolved   DVT prophylaxis: lovenox. Code Status:  Full code.  Family Communication: discussed with wife at bedside.  Disposition Plan:pending palliative care, most ly residential hospice.    Consultants:   Palliative care.    Procedures: None   Antimicrobials: rocephin.    Subjective: Moaning.   Objective: Vitals:   01/20/17 2030 01/20/17 2045 01/21/17 0501 01/21/17 1417  BP: (!) 147/89 (!) 156/70 (!) 131/54 129/66  Pulse: (!) 103 85 98 91  Resp: 16 15 16 20   Temp: 98.1 F (36.7 C) 98.7 F (37.1 C) 98 F (36.7 C) (!) 97.1 F (36.2 C)  TempSrc: Oral Oral Oral Oral  SpO2: 98% 100% 97% 94%  Weight:   57.7 kg (127 lb 3.3 oz)   Height:        Intake/Output Summary (Last 24 hours) at 01/21/2017 1828 Last data filed at 01/21/2017 1300 Gross per 24 hour  Intake 120 ml  Output 450 ml  Net -330 ml   Filed Weights   01/19/17 0647 01/20/17 0436 01/21/17 0501  Weight: 57.6 kg (127 lb) 55.8 kg (123 lb 0.3 oz) 57.7 kg (127 lb 3.3 oz)    Examination:  General exam: Sleeping.  Respiratory system: clear, no wheezing.  Cardiovascular system: S1 & S2 heard, RRR. No JVD, murmurs,   Gastrointestinal system: Abdomen is soft NT ND BS+ Central nervous system: confused.   Extremities: tender on the  right hip and any movement of the legs.  Psychiatry: confused.  No agitation   Data Reviewed: I have personally reviewed following labs and imaging studies  CBC: Recent Labs  Lab 01/18/17 1100 01/19/17 0625  WBC 16.2* 10.3  NEUTROABS 13.8*  --   HGB 12.4* 10.9*  HCT 39.3 35.3*  MCV 83.3 83.6  PLT 253 299   Basic Metabolic Panel: Recent Labs  Lab 01/18/17 1100 01/19/17 0625 01/20/17 0858  NA 144 145 146*  K 5.3* 3.9 3.8  CL 108 114* 116*  CO2 28 24 25   GLUCOSE 99 74 97  BUN 58* 56* 51*  CREATININE 2.20* 1.70* 1.53*  CALCIUM 8.7* 7.9* 7.9*   GFR: Estimated  Creatinine Clearance: 28.8 mL/min (A) (by C-G formula based on SCr of 1.53 mg/dL (H)). Liver Function Tests: Recent Labs  Lab 01/18/17 1100  AST 38  ALT 14*  ALKPHOS 115  BILITOT 1.2  PROT 6.6  ALBUMIN 2.8*   No results for input(s): LIPASE, AMYLASE in the last 168 hours. No results for input(s): AMMONIA in the last 168 hours. Coagulation Profile: No results for input(s): INR, PROTIME in the last 168 hours. Cardiac Enzymes: Recent Labs  Lab 01/18/17 1100 01/19/17 0625  CKTOTAL 458* 255   BNP (last 3 results) No results for input(s): PROBNP in the last 8760 hours. HbA1C: No results for input(s): HGBA1C in the last 72 hours. CBG: No results for input(s): GLUCAP in the last 168 hours. Lipid Profile: No results for input(s): CHOL, HDL, LDLCALC, TRIG, CHOLHDL, LDLDIRECT in the last 72 hours. Thyroid Function Tests: No results for input(s): TSH, T4TOTAL, FREET4, T3FREE, THYROIDAB in the last 72 hours. Anemia Panel: No results for input(s): VITAMINB12, FOLATE, FERRITIN, TIBC, IRON, RETICCTPCT in the last 72 hours. Sepsis Labs: Recent Labs  Lab 01/18/17 1142  LATICACIDVEN 1.56    Recent Results (from the past 240 hour(s))  Urine C&S     Status: Abnormal   Collection Time: 01/18/17 11:30 AM  Result Value Ref Range Status   Specimen Description URINE, CLEAN CATCH  Final   Special Requests NONE  Final   Culture >=100,000 COLONIES/mL ESCHERICHIA COLI (A)  Final   Report Status 01/20/2017 FINAL  Final   Organism ID, Bacteria ESCHERICHIA COLI (A)  Final      Susceptibility   Escherichia coli - MIC*    AMPICILLIN >=32 RESISTANT Resistant     CEFAZOLIN 8 SENSITIVE Sensitive     CEFTRIAXONE <=1 SENSITIVE Sensitive     CIPROFLOXACIN <=0.25 SENSITIVE Sensitive     GENTAMICIN <=1 SENSITIVE Sensitive     IMIPENEM <=0.25 SENSITIVE Sensitive     NITROFURANTOIN <=16 SENSITIVE Sensitive     TRIMETH/SULFA <=20 SENSITIVE Sensitive     AMPICILLIN/SULBACTAM 16 INTERMEDIATE  Intermediate     PIP/TAZO <=4 SENSITIVE Sensitive     Extended ESBL NEGATIVE Sensitive     * >=100,000 COLONIES/mL ESCHERICHIA COLI  MRSA PCR Screening     Status: None   Collection Time: 01/18/17  4:28 PM  Result Value Ref Range Status   MRSA by PCR NEGATIVE NEGATIVE Final    Comment:        The GeneXpert MRSA Assay (FDA approved for NASAL specimens only), is one component of a comprehensive MRSA colonization surveillance program. It is not intended to diagnose MRSA infection nor to guide or monitor treatment for MRSA infections.          Radiology Studies: No results found.      Scheduled  Meds: . citalopram  10 mg Oral Daily  . divalproex  250 mg Oral QHS  . donepezil  5 mg Oral QHS  . mirtazapine  15 mg Oral QHS  .  morphine injection  1 mg Intravenous Q6H  . QUEtiapine  25 mg Oral QHS   Continuous Infusions:    LOS: 3 days    Time spent: 35 minutes.     Hosie Poisson, MD [facility] Pager [phone]   If 7PM-7AM, please contact night-coverage [email or web address] Password [facility] 01/21/2017, 6:28 PM

## 2017-01-21 NOTE — Progress Notes (Signed)
[facility] Liaison: RN visit  Received request from Servando Snare, [place] for family interest in [facility]. Spoke with wife, Zigmund Daniel to acknowledge referral. Unfortunately [facility] is not able to offer a room today. Family and CSW are aware. [facility] liaison will follow up with CSW and family tomorrow or sooner if room becomes available. Please do not hesitate to call with questions.   Thank you,  Farrel Gordon, RN, [facility] Liaison  [place] are on [facility].

## 2017-01-22 NOTE — Progress Notes (Signed)
LCSW consulted for return to facility.  Patient from [facility].   Patient is not returning to [facility].   Patient will go to residential hospice at [place]. Family prefers [facility]. No beds today.   Carolin Coy [facility] [place]

## 2017-01-22 NOTE — Progress Notes (Addendum)
[facility]  Continue to follow for family interest in [facility]. Unfortunately [facility] is not able to offer a room today. CSW Bernette aware. Will update family and CSW if availability changes.   Thank you,  Erling Conte, LCSW [phone]

## 2017-01-22 NOTE — Progress Notes (Signed)
PROGRESS NOTE    Brett Santiago  QPR:[id number] DOB: 07/26/1929 DOA: 01/18/2017 PCP: Roger Shelter, MD (Inactive)    Brief Narrative: Brett Santiago is a 84 y.o. male with known colon cancer in 2008, vascular dementia, bladder cancer, follows with urologist Dr. Beatrix Fetters, resident of SNF, presented from facility for evaluation of sudden onset of change in mental status. Please note that pt is not able to provide detailed history due to AMS and per record review, pt apparently sustained unwitnessed fall at the facility and has subsequently complained of the right hip pain.     Pt continues decline despite antibiotics , fluids, and is approaching end of life.  Palliative consulted and plan for residential hospice if he is stable for transfer.  No new events today, minimal po intake and on morphine for pain control.     Assessment & Plan:   Active Problems:   Weakness generalized   Vascular dementia without behavioral disturbance   Transitional cell bladder cancer ([facility])   Bladder neoplasm   Dementia with aggressive behavior   Altered mental status   Right hip pain   Urinary tract infection   Rhabdomyolysis   AKI (acute kidney injury) ([place])   Acute metabolic encephalopathy, differential include, worsening of the dementia vs UTI  Vs acute renal failure vs end of life.  Pt continues to be delirious and confused.   Pt continues decline despite antibiotics , fluids, and is approaching end of life.  Palliative consulted and plan for residential hospice if he is stable for transfer.   Fall with right hip pain:  - metastatic lesions in the hip, discussed with wife at bedside, recommended hospice.  Pain control.  Mild rhabdo: Improved.   AKI:  Improving with hydration.  Probably from dehydration vs ATN FROM UTI.   No lab work ordered.   UTI: Urine cultures growing E COLI , and was on rocephin. D/c antibiotics as pt is approaching end of life.    Metastasis in the right  iliac wing and new soft tissue mass in the bladder consistent with recurrent metastatic tumour.  Palliative care consulted  Discussed the poor prognosis with the wife at bedside.     Hypertension Well-controlled.  Hyperkalemia Resolved  Hypernatremia; free water deficit from  decreased po intake.    DVT prophylaxis: lovenox. Code Status:  Full code.  Family Communication: discussed with wife at bedside on 1/7.  Disposition Plan:residential hospice.    Consultants:   Palliative care.    Procedures: None   Antimicrobials: rocephin.    Subjective: Confused, not in distress,   Objective: Vitals:   01/21/17 1417 01/21/17 2134 01/22/17 0552 01/22/17 1355  BP: 129/66 117/70 (!) 153/64 140/78  Pulse: 91 (!) 105 81 (!) 101  Resp: 20 19 20 20   Temp: (!) 97.1 F (36.2 C) 98.9 F (37.2 C) 98.1 F (36.7 C) (!) 96.5 F (35.8 C)  TempSrc: Oral Oral  Oral  SpO2: 94% 94% 97% 97%  Weight:   57.3 kg (126 lb 5.2 oz)   Height:        Intake/Output Summary (Last 24 hours) at 01/22/2017 1850 Last data filed at 01/22/2017 0900 Gross per 24 hour  Intake 120 ml  Output 300 ml  Net -180 ml   Filed Weights   01/20/17 0436 01/21/17 0501 01/22/17 0552  Weight: 55.8 kg (123 lb 0.3 oz) 57.7 kg (127 lb 3.3 oz) 57.3 kg (126 lb 5.2 oz)    Examination:  General exam:  cachetic looking gentleman, not in distress.  Respiratory system: clear, no wheezing.  Cardiovascular system: S1 & S2 heard, RRR. No JVD, murmurs,   Gastrointestinal system: Abdomen is soft NT ND BS+ Central nervous system: confused and delirious.  Extremities: tender on the right hip and any movement of the legs.  Psychiatry: confused.  No agitation   Data Reviewed: I have personally reviewed following labs and imaging studies  CBC: Recent Labs  Lab 01/18/17 1100 01/19/17 0625  WBC 16.2* 10.3  NEUTROABS 13.8*  --   HGB 12.4* 10.9*  HCT 39.3 35.3*  MCV 83.3 83.6  PLT 253 106   Basic Metabolic  Panel: Recent Labs  Lab 01/18/17 1100 01/19/17 0625 01/20/17 0858  NA 144 145 146*  K 5.3* 3.9 3.8  CL 108 114* 116*  CO2 28 24 25   GLUCOSE 99 74 97  BUN 58* 56* 51*  CREATININE 2.20* 1.70* 1.53*  CALCIUM 8.7* 7.9* 7.9*   GFR: Estimated Creatinine Clearance: 28.6 mL/min (A) (by C-G formula based on SCr of 1.53 mg/dL (H)). Liver Function Tests: Recent Labs  Lab 01/18/17 1100  AST 38  ALT 14*  ALKPHOS 115  BILITOT 1.2  PROT 6.6  ALBUMIN 2.8*   No results for input(s): LIPASE, AMYLASE in the last 168 hours. No results for input(s): AMMONIA in the last 168 hours. Coagulation Profile: No results for input(s): INR, PROTIME in the last 168 hours. Cardiac Enzymes: Recent Labs  Lab 01/18/17 1100 01/19/17 0625  CKTOTAL 458* 255   BNP (last 3 results) No results for input(s): PROBNP in the last 8760 hours. HbA1C: No results for input(s): HGBA1C in the last 72 hours. CBG: No results for input(s): GLUCAP in the last 168 hours. Lipid Profile: No results for input(s): CHOL, HDL, LDLCALC, TRIG, CHOLHDL, LDLDIRECT in the last 72 hours. Thyroid Function Tests: No results for input(s): TSH, T4TOTAL, FREET4, T3FREE, THYROIDAB in the last 72 hours. Anemia Panel: No results for input(s): VITAMINB12, FOLATE, FERRITIN, TIBC, IRON, RETICCTPCT in the last 72 hours. Sepsis Labs: Recent Labs  Lab 01/18/17 1142  LATICACIDVEN 1.56    Recent Results (from the past 240 hour(s))  Urine C&S     Status: Abnormal   Collection Time: 01/18/17 11:30 AM  Result Value Ref Range Status   Specimen Description URINE, CLEAN CATCH  Final   Special Requests NONE  Final   Culture >=100,000 COLONIES/mL ESCHERICHIA COLI (A)  Final   Report Status 01/20/2017 FINAL  Final   Organism ID, Bacteria ESCHERICHIA COLI (A)  Final      Susceptibility   Escherichia coli - MIC*    AMPICILLIN >=32 RESISTANT Resistant     CEFAZOLIN 8 SENSITIVE Sensitive     CEFTRIAXONE <=1 SENSITIVE Sensitive      CIPROFLOXACIN <=0.25 SENSITIVE Sensitive     GENTAMICIN <=1 SENSITIVE Sensitive     IMIPENEM <=0.25 SENSITIVE Sensitive     NITROFURANTOIN <=16 SENSITIVE Sensitive     TRIMETH/SULFA <=20 SENSITIVE Sensitive     AMPICILLIN/SULBACTAM 16 INTERMEDIATE Intermediate     PIP/TAZO <=4 SENSITIVE Sensitive     Extended ESBL NEGATIVE Sensitive     * >=100,000 COLONIES/mL ESCHERICHIA COLI  MRSA PCR Screening     Status: None   Collection Time: 01/18/17  4:28 PM  Result Value Ref Range Status   MRSA by PCR NEGATIVE NEGATIVE Final    Comment:        The GeneXpert MRSA Assay (FDA approved for NASAL specimens only), is one component  of a comprehensive MRSA colonization surveillance program. It is not intended to diagnose MRSA infection nor to guide or monitor treatment for MRSA infections.          Radiology Studies: No results found.      Scheduled Meds: . citalopram  10 mg Oral Daily  . divalproex  250 mg Oral QHS  . donepezil  5 mg Oral QHS  . mirtazapine  15 mg Oral QHS  .  morphine injection  1 mg Intravenous Q6H  . QUEtiapine  25 mg Oral QHS   Continuous Infusions:    LOS: 4 days    Time spent: 35 minutes.     Hosie Poisson, MD [facility] Pager [phone]   If 7PM-7AM, please contact night-coverage [email or web address] Password [email or web address] 01/22/2017, 6:50 PM

## 2017-01-23 NOTE — Discharge Summary (Signed)
Physician Discharge Summary  Brett Santiago:[id number] DOB: 22-Apr-1929 DOA: 01/18/2017  PCP: Roger Shelter, MD (Inactive)  Admit date: 01/18/2017 Discharge date: 01/23/2017  Admitted From: SNF Disposition: [place]   Recommendations for Outpatient Follow-up:  1. Comfort care  Home Health: N/A Equipment/Devices: N/A Discharge Condition: Stable for transfer to hospice, prognosis < 2 weeks.  CODE STATUS: DNR Diet recommendation: As tolerated  Brief/Interim Summary: Brett Santiago is an 84 y.o. male with a history of colon and bladder cancer, vascular dementia, and recurrent falls who presented 1/4 after a fall and AMS at SNF and associated poor per oral intake. Evaluation revealed recurrent bladder CA with involvement of bony pelvis. E. coli UTI was treated with antibiotics, AKI and mild rhabdomyolysis were resolved with IV fluids, and mental status/per oral intake have not improved. Palliative care was consulted and agree that his prognosis is limited, recommending transfer to residential hospice.   Discharge Diagnoses:  Active Problems:   Weakness generalized   Vascular dementia without behavioral disturbance   Transitional cell bladder cancer (HCC)   Bladder neoplasm   Dementia with aggressive behavior   Altered mental status   Right hip pain   Urinary tract infection   Rhabdomyolysis   AKI (acute kidney injury) ([place])  Acute metabolic encephalopathy: Progression of dementia, acute delirium, and suspected end of life favored as etiologies. UTI and renal failure treated without improvement.  - Supportive management  Fall with right hip pain: Metastatic lesions in the hip.  - Pain control at hospice as discussed with family.   Rhabdomyolysis and AKI due to unwitnessed fall and dehydration/UTI:  - Resolved w/IVF's.   E. coli UTI: Targeted abx given without improvement.  - DC abx given comfort care.   Metastasis in the right iliac wing and new  soft tissue mass in the bladder consistent with recurrent metastatic tumour.  - Not a candidate for further therapy. Palliative care consulted: Discussed the poor prognosis with the wife at bedside.    Hypertension: Well-controlled.  Hyperkalemia: Resolved  Hypernatremia; free water deficit from  decreased po intake.   Discharge Instructions Discharge Instructions    Discharge instructions   Complete by:  As directed    Continue comfort measures. Pt's seroquel, haldol, remeron and depakote included on med list as below as reference. Medications per hospice MD.     Allergies as of 01/23/2017   No Known Allergies     Medication List    STOP taking these medications   citalopram 10 MG tablet Commonly known as:  CELEXA   donepezil 5 MG tablet Commonly known as:  ARICEPT   MULTIVITAMIN PO     TAKE these medications   divalproex 125 MG capsule Commonly known as:  DEPAKOTE SPRINKLE Take 250 mg by mouth at bedtime.   haloperidol 0.5 MG tablet Commonly known as:  HALDOL Take 0.5 mg by mouth every 4 (four) hours as needed for agitation.   mirtazapine 15 MG tablet Commonly known as:  REMERON Take 15 mg by mouth at bedtime.   QUEtiapine 25 MG tablet Commonly known as:  SEROQUEL Take 25 mg by mouth at bedtime.       No Known Allergies  Consultations:  Palliative care  Procedures/Studies: Ct Hip Right Wo Contrast  Result Date: 01/18/2017 CLINICAL DATA:  Right hip pain due to a fall today. Initial encounter. EXAM: CT OF THE RIGHT HIP WITHOUT CONTRAST TECHNIQUE: Multidetector CT imaging of the right hip was performed according to the  standard protocol. Multiplanar CT image reconstructions were also generated. COMPARISON:  Plain films right hip earlier today. CT abdomen and pelvis 02/15/2016. FINDINGS: Bones/Joint/Cartilage Since the prior CT scan, the patient has developed a destructive lesion in the right ilium extending into both the iliacus muscle and gluteal  musculature. The lesion measures approximately 5.3 cm transverse by 4 cm AP on image 11 of series 11. No other lytic lesion is identified. The patient does have a new sclerotic focus in the right pubic bone measuring 2.2 cm AP by up to 1.2 cm transverse on image 48 of series 2. No other focal lesion is identified. The hip is located and no hip fracture is seen. Ligaments Suboptimally assessed by CT. Muscles and Tendons Intact. Soft tissues Although poorly defined without contrast, there is a new soft tissue mass in the right pelvis measuring approximately 5.2 cm AP by 3.6 cm transverse. This lesion is contiguous with the right pelvic wall musculature and extends inferiorly to the level of the medial wall of the acetabulum. The right bladder wall is markedly thickened. IMPRESSION: New large destructive lesion in the right iliac wing and new sclerotic lesion in the right pubic bone are likely due to metastatic disease in this patient with a history of bladder carcinoma. Marked thickening of the right bladder wall and a new large soft tissue mass in the right pelvis are also consistent with recurrent and/or metastatic tumor. Negative for hip fracture. Electronically Signed   By: Inge Rise M.D.   On: 01/18/2017 11:21   Dg Chest Port 1 View  Result Date: 01/18/2017 CLINICAL DATA:  Unwitnessed fall.  Hip pain.  Dementia. EXAM: PORTABLE CHEST 1 VIEW COMPARISON:  03/06/2016 FINDINGS: Heart size is normal. Aortic atherosclerosis. Previous surgical clips in the right hilum and lower lung. Basilar scarring right more than left. No sign of active infiltrate, mass, effusion or collapse. No acute bone finding. IMPRESSION: No active disease. No traumatic finding. Previous surgical changes in the right infrahilar region and lower lung. Pulmonary scarring. Aortic atherosclerosis. Electronically Signed   By: Nelson Chimes M.D.   On: 01/18/2017 11:18   Dg Hip Unilat  With Pelvis 2-3 Views Right  Result Date:  01/18/2017 CLINICAL DATA:  Fall EXAM: DG HIP (WITH OR WITHOUT PELVIS) 2-3V RIGHT COMPARISON:  09/19/2016 FINDINGS: Early symmetric degenerative changes in the hips with joint space narrowing and spurring. SI joints are symmetric and unremarkable. There is a linear lucency noted in the proximal femur inferior to the intertrochanteric region on both AP views. I favor this is related to overlying soft tissue lying, but is difficult to completely exclude an intertrochanteric fracture. Consider further evaluation with CT. IMPRESSION: Lucency noted in the intertrochanteric region, likely within the overlapping soft tissues, but difficult to completely exclude intertrochanteric fracture. May consider further evaluation with CT. Electronically Signed   By: Rolm Baptise M.D.   On: 01/18/2017 10:12    Subjective: Pt resting quietly does not open eyes but repeats my name and denies any pain. When leaving, the patient thanks me for stopping by.   Discharge Exam: BP 139/81 (BP Location: Left Arm)   Pulse 93   Temp 97.7 F (36.5 C) (Oral)   Resp (!) 24   Ht 6' (1.829 m)   Wt 57.3 kg (126 lb 5.2 oz)   SpO2 97%   BMI 17.13 kg/m   General: Drowsy, cachectic male in no distress Cardiovascular: RRR, no JVD Respiratory: Nonlabored, clear Abdominal: Soft, NT, ND, bowel sounds +  Neuro: Calm and not oriented.  Labs: Basic Metabolic Panel: Recent Labs  Lab 01/18/17 1100 01/19/17 0625 01/20/17 0858  NA 144 145 146*  K 5.3* 3.9 3.8  CL 108 114* 116*  CO2 28 24 25   GLUCOSE 99 74 97  BUN 58* 56* 51*  CREATININE 2.20* 1.70* 1.53*  CALCIUM 8.7* 7.9* 7.9*   Liver Function Tests: Recent Labs  Lab 01/18/17 1100  AST 38  ALT 14*  ALKPHOS 115  BILITOT 1.2  PROT 6.6  ALBUMIN 2.8*   CBC: Recent Labs  Lab 01/18/17 1100 01/19/17 0625  WBC 16.2* 10.3  NEUTROABS 13.8*  --   HGB 12.4* 10.9*  HCT 39.3 35.3*  MCV 83.3 83.6  PLT 253 229   Cardiac Enzymes: Recent Labs  Lab 01/18/17 1100  01/19/17 0625  CKTOTAL 458* 255   Urinalysis    Component Value Date/Time   COLORURINE YELLOW 01/18/2017 1130   APPEARANCEUR TURBID (A) 01/18/2017 1130   LABSPEC 1.017 01/18/2017 1130   PHURINE 6.0 01/18/2017 1130   GLUCOSEU NEGATIVE 01/18/2017 1130   HGBUR LARGE (A) 01/18/2017 1130   BILIRUBINUR NEGATIVE 01/18/2017 1130   KETONESUR NEGATIVE 01/18/2017 1130   PROTEINUR 100 (A) 01/18/2017 1130   UROBILINOGEN 0.2 02/14/2007 1101   NITRITE NEGATIVE 01/18/2017 1130   LEUKOCYTESUR LARGE (A) 01/18/2017 1130    Microbiology Recent Results (from the past 240 hour(s))  Urine C&S     Status: Abnormal   Collection Time: 01/18/17 11:30 AM  Result Value Ref Range Status   Specimen Description URINE, CLEAN CATCH  Final   Special Requests NONE  Final   Culture >=100,000 COLONIES/mL ESCHERICHIA COLI (A)  Final   Report Status 01/20/2017 FINAL  Final   Organism ID, Bacteria ESCHERICHIA COLI (A)  Final      Susceptibility   Escherichia coli - MIC*    AMPICILLIN >=32 RESISTANT Resistant     CEFAZOLIN 8 SENSITIVE Sensitive     CEFTRIAXONE <=1 SENSITIVE Sensitive     CIPROFLOXACIN <=0.25 SENSITIVE Sensitive     GENTAMICIN <=1 SENSITIVE Sensitive     IMIPENEM <=0.25 SENSITIVE Sensitive     NITROFURANTOIN <=16 SENSITIVE Sensitive     TRIMETH/SULFA <=20 SENSITIVE Sensitive     AMPICILLIN/SULBACTAM 16 INTERMEDIATE Intermediate     PIP/TAZO <=4 SENSITIVE Sensitive     Extended ESBL NEGATIVE Sensitive     * >=100,000 COLONIES/mL ESCHERICHIA COLI  MRSA PCR Screening     Status: None   Collection Time: 01/18/17  4:28 PM  Result Value Ref Range Status   MRSA by PCR NEGATIVE NEGATIVE Final    Comment:        The GeneXpert MRSA Assay (FDA approved for NASAL specimens only), is one component of a comprehensive MRSA colonization surveillance program. It is not intended to diagnose MRSA infection nor to guide or monitor treatment for MRSA infections.     Time coordinating discharge:  Approximately 40 minutes  Vance Gather, MD  [facility] 01/23/2017, 11:07 AM Pager [phone]

## 2017-01-23 NOTE — Progress Notes (Signed)
LCSW following for residential hospice placement.  Patient going to [facility].   Patient will transport by [facility].   Family completed paperwork witt [employer].  Brett Santiago

## 2017-01-23 NOTE — Progress Notes (Signed)
[facility] room available for Mr. Mateo today. Completed paper work with spouse at bedside. Dr. Tomasa Hosteller to assume care per family preference.   RN please call report to [phone].  Discharge summary has been sent to [phone].  Thank you,  Erling Conte, LCSW [phone]

## 2017-01-23 NOTE — Progress Notes (Signed)
Report given to [place] at [facility].  All questions answered.

## 2017-02-15 DEATH — deceased
